# Patient Record
Sex: Male | Born: 1935 | Race: Black or African American | Hispanic: No | Marital: Single
Health system: Southern US, Community
[De-identification: ages and names within clinical notes are randomized; demographics above are authoritative.]

---

## 2007-05-06 ENCOUNTER — Ambulatory Visit: Payer: Self-pay | Admitting: Internal Medicine

## 2007-05-07 ENCOUNTER — Ambulatory Visit: Payer: Self-pay | Admitting: Internal Medicine

## 2007-05-08 ENCOUNTER — Ambulatory Visit: Payer: Self-pay | Admitting: Internal Medicine

## 2007-05-12 ENCOUNTER — Other Ambulatory Visit: Payer: Self-pay

## 2007-05-12 ENCOUNTER — Inpatient Hospital Stay: Payer: Self-pay | Admitting: Internal Medicine

## 2007-05-29 ENCOUNTER — Ambulatory Visit: Payer: Self-pay | Admitting: Internal Medicine

## 2007-06-06 ENCOUNTER — Ambulatory Visit: Payer: Self-pay | Admitting: Internal Medicine

## 2007-06-10 ENCOUNTER — Ambulatory Visit: Payer: Self-pay | Admitting: Family Medicine

## 2007-06-14 ENCOUNTER — Ambulatory Visit: Payer: Self-pay | Admitting: Internal Medicine

## 2007-06-15 ENCOUNTER — Ambulatory Visit: Payer: Self-pay

## 2007-07-07 ENCOUNTER — Ambulatory Visit: Payer: Self-pay | Admitting: Internal Medicine

## 2007-07-08 ENCOUNTER — Ambulatory Visit: Payer: Self-pay | Admitting: Internal Medicine

## 2007-07-13 ENCOUNTER — Emergency Department: Payer: Self-pay | Admitting: Emergency Medicine

## 2007-07-13 ENCOUNTER — Ambulatory Visit: Payer: Self-pay

## 2007-07-14 ENCOUNTER — Ambulatory Visit: Payer: Self-pay

## 2007-07-14 ENCOUNTER — Other Ambulatory Visit: Payer: Self-pay

## 2007-07-27 ENCOUNTER — Ambulatory Visit: Payer: Self-pay

## 2007-08-04 ENCOUNTER — Ambulatory Visit: Payer: Self-pay | Admitting: Internal Medicine

## 2007-09-04 ENCOUNTER — Ambulatory Visit: Payer: Self-pay | Admitting: Internal Medicine

## 2007-10-04 ENCOUNTER — Ambulatory Visit: Payer: Self-pay | Admitting: Internal Medicine

## 2007-11-04 ENCOUNTER — Ambulatory Visit: Payer: Self-pay | Admitting: Internal Medicine

## 2007-12-04 ENCOUNTER — Ambulatory Visit: Payer: Self-pay | Admitting: Internal Medicine

## 2008-02-12 ENCOUNTER — Ambulatory Visit: Payer: Self-pay | Admitting: Internal Medicine

## 2008-02-21 ENCOUNTER — Ambulatory Visit: Payer: Self-pay | Admitting: Family Medicine

## 2008-03-05 ENCOUNTER — Ambulatory Visit: Payer: Self-pay | Admitting: Internal Medicine

## 2008-05-05 ENCOUNTER — Ambulatory Visit: Payer: Self-pay | Admitting: Internal Medicine

## 2008-05-06 ENCOUNTER — Ambulatory Visit: Payer: Self-pay | Admitting: Internal Medicine

## 2008-06-05 ENCOUNTER — Ambulatory Visit: Payer: Self-pay | Admitting: Internal Medicine

## 2008-07-06 ENCOUNTER — Ambulatory Visit: Payer: Self-pay | Admitting: Internal Medicine

## 2008-08-03 ENCOUNTER — Ambulatory Visit: Payer: Self-pay | Admitting: Internal Medicine

## 2008-09-03 ENCOUNTER — Ambulatory Visit: Payer: Self-pay | Admitting: Internal Medicine

## 2008-09-08 ENCOUNTER — Ambulatory Visit: Payer: Self-pay | Admitting: Internal Medicine

## 2008-10-03 ENCOUNTER — Ambulatory Visit: Payer: Self-pay | Admitting: Internal Medicine

## 2008-11-03 ENCOUNTER — Ambulatory Visit: Payer: Self-pay | Admitting: Internal Medicine

## 2008-12-03 ENCOUNTER — Ambulatory Visit: Payer: Self-pay | Admitting: Internal Medicine

## 2009-01-03 ENCOUNTER — Ambulatory Visit: Payer: Self-pay | Admitting: Internal Medicine

## 2009-02-03 ENCOUNTER — Ambulatory Visit: Payer: Self-pay | Admitting: Internal Medicine

## 2009-03-05 ENCOUNTER — Ambulatory Visit: Payer: Self-pay | Admitting: Internal Medicine

## 2009-04-05 ENCOUNTER — Ambulatory Visit: Payer: Self-pay | Admitting: Internal Medicine

## 2009-05-05 ENCOUNTER — Ambulatory Visit: Payer: Self-pay | Admitting: Internal Medicine

## 2009-06-05 ENCOUNTER — Ambulatory Visit: Payer: Self-pay | Admitting: Internal Medicine

## 2009-06-07 ENCOUNTER — Ambulatory Visit: Payer: Self-pay | Admitting: Internal Medicine

## 2009-07-06 ENCOUNTER — Ambulatory Visit: Payer: Self-pay | Admitting: Internal Medicine

## 2009-08-03 ENCOUNTER — Ambulatory Visit: Payer: Self-pay | Admitting: Internal Medicine

## 2009-09-03 ENCOUNTER — Ambulatory Visit: Payer: Self-pay | Admitting: Internal Medicine

## 2009-10-03 ENCOUNTER — Ambulatory Visit: Payer: Self-pay | Admitting: Internal Medicine

## 2009-11-22 ENCOUNTER — Ambulatory Visit: Payer: Self-pay | Admitting: Internal Medicine

## 2009-11-28 ENCOUNTER — Emergency Department: Payer: Self-pay | Admitting: Emergency Medicine

## 2009-12-03 ENCOUNTER — Ambulatory Visit: Payer: Self-pay | Admitting: Internal Medicine

## 2009-12-13 ENCOUNTER — Ambulatory Visit: Payer: Self-pay | Admitting: Internal Medicine

## 2010-01-03 ENCOUNTER — Ambulatory Visit: Payer: Self-pay | Admitting: Internal Medicine

## 2010-01-03 ENCOUNTER — Inpatient Hospital Stay: Payer: Self-pay | Admitting: Internal Medicine

## 2010-01-12 ENCOUNTER — Ambulatory Visit: Payer: Self-pay | Admitting: Vascular Surgery

## 2010-01-15 ENCOUNTER — Emergency Department: Payer: Self-pay | Admitting: Emergency Medicine

## 2010-01-17 ENCOUNTER — Ambulatory Visit: Payer: Self-pay | Admitting: Internal Medicine

## 2010-01-19 ENCOUNTER — Other Ambulatory Visit: Payer: Self-pay | Admitting: Geriatric Medicine

## 2010-02-03 ENCOUNTER — Ambulatory Visit: Payer: Self-pay | Admitting: Internal Medicine

## 2010-03-05 ENCOUNTER — Ambulatory Visit: Payer: Self-pay | Admitting: Internal Medicine

## 2010-03-09 ENCOUNTER — Ambulatory Visit: Payer: Self-pay | Admitting: Vascular Surgery

## 2010-03-16 ENCOUNTER — Ambulatory Visit: Payer: Self-pay | Admitting: Vascular Surgery

## 2010-03-23 ENCOUNTER — Emergency Department: Payer: Self-pay | Admitting: Emergency Medicine

## 2010-04-27 ENCOUNTER — Ambulatory Visit: Payer: Self-pay | Admitting: Internal Medicine

## 2010-07-16 ENCOUNTER — Emergency Department: Payer: Self-pay | Admitting: Emergency Medicine

## 2010-07-18 ENCOUNTER — Ambulatory Visit: Payer: Self-pay | Admitting: Vascular Surgery

## 2010-10-04 ENCOUNTER — Ambulatory Visit: Payer: Self-pay | Admitting: Internal Medicine

## 2010-10-15 ENCOUNTER — Ambulatory Visit: Payer: Self-pay | Admitting: Vascular Surgery

## 2010-10-22 ENCOUNTER — Other Ambulatory Visit: Payer: Self-pay | Admitting: Geriatric Medicine

## 2010-10-31 ENCOUNTER — Inpatient Hospital Stay: Payer: Self-pay | Admitting: Internal Medicine

## 2010-11-04 ENCOUNTER — Ambulatory Visit: Payer: Self-pay | Admitting: Internal Medicine

## 2011-01-06 ENCOUNTER — Ambulatory Visit: Payer: Self-pay | Admitting: Internal Medicine

## 2011-02-04 ENCOUNTER — Ambulatory Visit: Payer: Self-pay | Admitting: Internal Medicine

## 2011-03-06 ENCOUNTER — Ambulatory Visit: Payer: Self-pay | Admitting: Internal Medicine

## 2011-07-03 ENCOUNTER — Ambulatory Visit: Payer: Self-pay | Admitting: Internal Medicine

## 2011-07-03 LAB — CBC CANCER CENTER
Eosinophil #: 0.2 x10 3/mm (ref 0.0–0.7)
Eosinophil %: 4.2 %
HCT: 29 % — ABNORMAL LOW (ref 40.0–52.0)
Lymphocyte #: 1.1 x10 3/mm (ref 1.0–3.6)
MCH: 33.2 pg (ref 26.0–34.0)
MCHC: 33 g/dL (ref 32.0–36.0)
MCV: 101 fL — ABNORMAL HIGH (ref 80–100)
Monocyte #: 0.7 x10 3/mm (ref 0.0–0.7)
Platelet: 148 x10 3/mm — ABNORMAL LOW (ref 150–440)
RBC: 2.88 10*6/uL — ABNORMAL LOW (ref 4.40–5.90)

## 2011-07-07 ENCOUNTER — Ambulatory Visit: Payer: Self-pay | Admitting: Internal Medicine

## 2012-02-12 ENCOUNTER — Ambulatory Visit: Payer: Self-pay | Admitting: Vascular Surgery

## 2012-03-04 ENCOUNTER — Ambulatory Visit: Payer: Self-pay | Admitting: Internal Medicine

## 2012-03-04 LAB — CBC CANCER CENTER
Eosinophil %: 2.1 %
Lymphocyte #: 0.8 x10 3/mm — ABNORMAL LOW (ref 1.0–3.6)
Lymphocyte %: 18.3 %
MCH: 30.9 pg (ref 26.0–34.0)
MCV: 98 fL (ref 80–100)
Monocyte #: 0.5 x10 3/mm (ref 0.2–1.0)
Monocyte %: 11.2 %
Neutrophil %: 68 %
Platelet: 128 x10 3/mm — ABNORMAL LOW (ref 150–440)
RDW: 16.2 % — ABNORMAL HIGH (ref 11.5–14.5)
WBC: 4.4 x10 3/mm (ref 3.8–10.6)

## 2012-03-04 LAB — RETICULOCYTES
Absolute Retic Count: 0.0711 10*6/uL (ref 0.031–0.129)
Reticulocyte: 2.07 % (ref 0.7–2.5)

## 2012-03-05 ENCOUNTER — Ambulatory Visit: Payer: Self-pay | Admitting: Internal Medicine

## 2012-03-22 ENCOUNTER — Ambulatory Visit: Payer: Self-pay | Admitting: Vascular Surgery

## 2012-04-15 ENCOUNTER — Ambulatory Visit: Payer: Self-pay | Admitting: Vascular Surgery

## 2012-04-17 ENCOUNTER — Ambulatory Visit: Payer: Self-pay | Admitting: Vascular Surgery

## 2012-04-17 LAB — BASIC METABOLIC PANEL
BUN: 36 mg/dL — ABNORMAL HIGH (ref 7–18)
EGFR (African American): 8 — ABNORMAL LOW
EGFR (Non-African Amer.): 7 — ABNORMAL LOW
Glucose: 101 mg/dL — ABNORMAL HIGH (ref 65–99)
Osmolality: 282 (ref 275–301)
Potassium: 3.9 mmol/L (ref 3.5–5.1)
Sodium: 137 mmol/L (ref 136–145)

## 2012-04-17 LAB — CBC
HCT: 30.9 % — ABNORMAL LOW (ref 40.0–52.0)
MCH: 32.7 pg (ref 26.0–34.0)
MCHC: 34 g/dL (ref 32.0–36.0)
MCV: 96 fL (ref 80–100)
Platelet: 111 10*3/uL — ABNORMAL LOW (ref 150–440)
RDW: 16.4 % — ABNORMAL HIGH (ref 11.5–14.5)
WBC: 4.1 10*3/uL (ref 3.8–10.6)

## 2012-04-24 ENCOUNTER — Ambulatory Visit: Payer: Self-pay | Admitting: Vascular Surgery

## 2012-04-25 ENCOUNTER — Emergency Department: Payer: Self-pay | Admitting: Emergency Medicine

## 2012-04-25 LAB — CBC
HCT: 31.4 % — ABNORMAL LOW (ref 40.0–52.0)
HGB: 10.3 g/dL — ABNORMAL LOW (ref 13.0–18.0)
RBC: 3.25 10*6/uL — ABNORMAL LOW (ref 4.40–5.90)
RDW: 17.2 % — ABNORMAL HIGH (ref 11.5–14.5)
WBC: 6.3 10*3/uL (ref 3.8–10.6)

## 2012-04-25 LAB — COMPREHENSIVE METABOLIC PANEL
Albumin: 3 g/dL — ABNORMAL LOW (ref 3.4–5.0)
Alkaline Phosphatase: 111 U/L (ref 50–136)
BUN: 10 mg/dL (ref 7–18)
Bilirubin,Total: 0.3 mg/dL (ref 0.2–1.0)
Calcium, Total: 9 mg/dL (ref 8.5–10.1)
Chloride: 102 mmol/L (ref 98–107)
Creatinine: 3.68 mg/dL — ABNORMAL HIGH (ref 0.60–1.30)
EGFR (African American): 17 — ABNORMAL LOW
Glucose: 80 mg/dL (ref 65–99)
SGOT(AST): 22 U/L (ref 15–37)
SGPT (ALT): 10 U/L — ABNORMAL LOW (ref 12–78)
Total Protein: 7.7 g/dL (ref 6.4–8.2)

## 2012-04-25 LAB — TROPONIN I: Troponin-I: <0.02 ng/mL

## 2012-04-25 LAB — VALPROIC ACID LEVEL: Valproic Acid: 54 ug/mL

## 2012-05-01 LAB — CULTURE, BLOOD (SINGLE)

## 2012-06-10 ENCOUNTER — Ambulatory Visit: Payer: Self-pay | Admitting: Vascular Surgery

## 2012-07-15 ENCOUNTER — Ambulatory Visit: Payer: Self-pay | Admitting: Vascular Surgery

## 2012-08-03 ENCOUNTER — Ambulatory Visit: Payer: Self-pay | Admitting: Internal Medicine

## 2012-08-19 LAB — CBC CANCER CENTER
Basophil #: 0 x10 3/mm (ref 0.0–0.1)
Eosinophil #: 0.1 x10 3/mm (ref 0.0–0.7)
Eosinophil %: 2.5 %
MCH: 31.4 pg (ref 26.0–34.0)
MCHC: 32.4 g/dL (ref 32.0–36.0)
MCV: 97 fL (ref 80–100)
Monocyte #: 0.6 x10 3/mm (ref 0.2–1.0)
Neutrophil #: 2.3 x10 3/mm (ref 1.4–6.5)
Neutrophil %: 56.6 %
Platelet: 91 x10 3/mm — ABNORMAL LOW (ref 150–440)
RBC: 4.04 10*6/uL — ABNORMAL LOW (ref 4.40–5.90)

## 2012-08-19 LAB — FOLATE: Folic Acid: 19.6 ng/mL (ref 3.1–100.0)

## 2012-08-19 LAB — IRON AND TIBC: Iron: 55 ug/dL — ABNORMAL LOW (ref 65–175)

## 2012-09-06 ENCOUNTER — Ambulatory Visit: Payer: Self-pay | Admitting: Vascular Surgery

## 2012-09-07 ENCOUNTER — Ambulatory Visit: Payer: Self-pay | Admitting: Internal Medicine

## 2012-09-23 ENCOUNTER — Ambulatory Visit: Payer: Self-pay | Admitting: Vascular Surgery

## 2012-09-23 LAB — CBC
HCT: 30.9 % — ABNORMAL LOW (ref 40.0–52.0)
MCH: 31.1 pg (ref 26.0–34.0)
MCHC: 32.4 g/dL (ref 32.0–36.0)
MCV: 96 fL (ref 80–100)
Platelet: 79 10*3/uL — ABNORMAL LOW (ref 150–440)
RBC: 3.22 10*6/uL — ABNORMAL LOW (ref 4.40–5.90)
RDW: 17.2 % — ABNORMAL HIGH (ref 11.5–14.5)

## 2012-09-23 LAB — BASIC METABOLIC PANEL
Anion Gap: 7 (ref 7–16)
BUN: 38 mg/dL — ABNORMAL HIGH (ref 7–18)
Calcium, Total: 9 mg/dL (ref 8.5–10.1)
Chloride: 100 mmol/L (ref 98–107)
Co2: 31 mmol/L (ref 21–32)
Creatinine: 7.44 mg/dL — ABNORMAL HIGH (ref 0.60–1.30)
Osmolality: 283 (ref 275–301)
Potassium: 3.7 mmol/L (ref 3.5–5.1)
Sodium: 138 mmol/L (ref 136–145)

## 2012-09-27 ENCOUNTER — Ambulatory Visit: Payer: Self-pay | Admitting: Vascular Surgery

## 2012-09-27 LAB — POTASSIUM: Potassium: 3.4 mmol/L — ABNORMAL LOW (ref 3.5–5.1)

## 2012-12-04 ENCOUNTER — Ambulatory Visit: Payer: Self-pay | Admitting: Vascular Surgery

## 2013-02-19 ENCOUNTER — Ambulatory Visit: Payer: Self-pay | Admitting: Physician Assistant

## 2013-03-24 ENCOUNTER — Ambulatory Visit: Payer: Self-pay | Admitting: Vascular Surgery

## 2013-03-24 LAB — BASIC METABOLIC PANEL
Calcium, Total: 9.3 mg/dL (ref 8.5–10.1)
Co2: 27 mmol/L (ref 21–32)
Creatinine: 11.09 mg/dL — ABNORMAL HIGH (ref 0.60–1.30)
EGFR (African American): 4 — ABNORMAL LOW
Glucose: 73 mg/dL (ref 65–99)
Osmolality: 286 (ref 275–301)
Potassium: 5.1 mmol/L (ref 3.5–5.1)
Sodium: 134 mmol/L — ABNORMAL LOW (ref 136–145)

## 2013-04-04 ENCOUNTER — Ambulatory Visit: Payer: Self-pay | Admitting: Vascular Surgery

## 2013-04-15 LAB — CBC
HCT: 28.9 % — ABNORMAL LOW (ref 40.0–52.0)
HGB: 9.5 g/dL — ABNORMAL LOW (ref 13.0–18.0)
MCH: 33.7 pg (ref 26.0–34.0)
MCV: 102 fL — ABNORMAL HIGH (ref 80–100)
Platelet: 146 10*3/uL — ABNORMAL LOW (ref 150–440)
RDW: 17.4 % — ABNORMAL HIGH (ref 11.5–14.5)
WBC: 3.9 10*3/uL (ref 3.8–10.6)

## 2013-04-15 LAB — COMPREHENSIVE METABOLIC PANEL
Albumin: 3.3 g/dL — ABNORMAL LOW (ref 3.4–5.0)
Anion Gap: 6 — ABNORMAL LOW (ref 7–16)
BUN: 44 mg/dL — ABNORMAL HIGH (ref 7–18)
Calcium, Total: 9.4 mg/dL (ref 8.5–10.1)
Co2: 29 mmol/L (ref 21–32)
Creatinine: 7.78 mg/dL — ABNORMAL HIGH (ref 0.60–1.30)
EGFR (African American): 7 — ABNORMAL LOW
EGFR (Non-African Amer.): 6 — ABNORMAL LOW
SGOT(AST): 24 U/L (ref 15–37)
SGPT (ALT): 13 U/L (ref 12–78)
Sodium: 138 mmol/L (ref 136–145)
Total Protein: 7.7 g/dL (ref 6.4–8.2)

## 2013-04-15 LAB — TSH: Thyroid Stimulating Horm: 2.06 u[IU]/mL

## 2013-04-15 LAB — ETHANOL
Ethanol %: <0.003 % (ref 0.000–0.080)
Ethanol: <3 mg/dL

## 2013-04-16 LAB — URINALYSIS, COMPLETE
Bacteria: NONE SEEN
Glucose,UR: NEGATIVE mg/dL (ref 0–75)
Nitrite: NEGATIVE
Ph: 8 (ref 4.5–8.0)
RBC,UR: 4 /HPF (ref 0–5)
Specific Gravity: 1.006 (ref 1.003–1.030)
Squamous Epithelial: <1
WBC UR: 9 /HPF (ref 0–5)

## 2013-04-16 LAB — BASIC METABOLIC PANEL
Anion Gap: 8 (ref 7–16)
BUN: 52 mg/dL — ABNORMAL HIGH (ref 7–18)
Co2: 29 mmol/L (ref 21–32)
Creatinine: 8.97 mg/dL — ABNORMAL HIGH (ref 0.60–1.30)
EGFR (African American): 6 — ABNORMAL LOW
EGFR (Non-African Amer.): 5 — ABNORMAL LOW
Glucose: 81 mg/dL (ref 65–99)
Osmolality: 292 (ref 275–301)
Potassium: 4.2 mmol/L (ref 3.5–5.1)
Sodium: 140 mmol/L (ref 136–145)

## 2013-04-16 LAB — DRUG SCREEN, URINE
Amphetamines, Ur Screen: NEGATIVE (ref ?–1000)
Barbiturates, Ur Screen: NEGATIVE (ref ?–200)
MDMA (Ecstasy)Ur Screen: NEGATIVE (ref ?–500)
Methadone, Ur Screen: NEGATIVE (ref ?–300)
Opiate, Ur Screen: NEGATIVE (ref ?–300)
Tricyclic, Ur Screen: NEGATIVE (ref ?–1000)

## 2013-04-17 LAB — BASIC METABOLIC PANEL
Anion Gap: 4 — ABNORMAL LOW (ref 7–16)
BUN: 72 mg/dL — ABNORMAL HIGH (ref 7–18)
Creatinine: 10.13 mg/dL — ABNORMAL HIGH (ref 0.60–1.30)
EGFR (African American): 5 — ABNORMAL LOW
EGFR (Non-African Amer.): 4 — ABNORMAL LOW
Glucose: 72 mg/dL (ref 65–99)
Potassium: 5 mmol/L (ref 3.5–5.1)
Sodium: 141 mmol/L (ref 136–145)

## 2013-04-18 LAB — BASIC METABOLIC PANEL
BUN: 76 mg/dL — ABNORMAL HIGH (ref 7–18)
EGFR (African American): 4 — ABNORMAL LOW
EGFR (Non-African Amer.): 4 — ABNORMAL LOW
Osmolality: 302 (ref 275–301)
Potassium: 4.9 mmol/L (ref 3.5–5.1)

## 2013-04-18 LAB — CBC WITH DIFFERENTIAL/PLATELET
Basophil #: 0 10*3/uL (ref 0.0–0.1)
Basophil %: 0.6 %
Eosinophil #: 0.1 10*3/uL (ref 0.0–0.7)
HCT: 28.7 % — ABNORMAL LOW (ref 40.0–52.0)
HGB: 9.6 g/dL — ABNORMAL LOW (ref 13.0–18.0)
Lymphocyte #: 0.8 10*3/uL — ABNORMAL LOW (ref 1.0–3.6)
Lymphocyte %: 17.6 %
Monocyte #: 0.5 x10 3/mm (ref 0.2–1.0)
Neutrophil #: 3 10*3/uL (ref 1.4–6.5)
RDW: 16.8 % — ABNORMAL HIGH (ref 11.5–14.5)

## 2013-04-18 LAB — PHOSPHORUS: Phosphorus: 5.2 mg/dL — ABNORMAL HIGH (ref 2.5–4.9)

## 2013-04-21 ENCOUNTER — Inpatient Hospital Stay: Payer: Self-pay | Admitting: Internal Medicine

## 2013-04-21 ENCOUNTER — Ambulatory Visit: Payer: Self-pay | Admitting: Internal Medicine

## 2013-05-05 ENCOUNTER — Ambulatory Visit: Payer: Self-pay | Admitting: Internal Medicine

## 2013-06-18 ENCOUNTER — Ambulatory Visit: Payer: Self-pay | Admitting: Vascular Surgery

## 2013-06-30 ENCOUNTER — Ambulatory Visit: Payer: Self-pay | Admitting: Vascular Surgery

## 2013-07-21 ENCOUNTER — Ambulatory Visit: Payer: Self-pay | Admitting: Vascular Surgery

## 2013-09-03 ENCOUNTER — Ambulatory Visit: Payer: Self-pay | Admitting: Physician Assistant

## 2013-09-29 LAB — CBC
HCT: 29.5 % — ABNORMAL LOW (ref 40.0–52.0)
HCT: 29.4 % — ABNORMAL LOW (ref 40.0–52.0)
HGB: 9.8 g/dL — ABNORMAL LOW (ref 13.0–18.0)
HGB: 9.8 g/dL — ABNORMAL LOW (ref 13.0–18.0)
MCH: 33.4 pg (ref 26.0–34.0)
MCH: 33.5 pg (ref 26.0–34.0)
MCHC: 33.1 g/dL (ref 32.0–36.0)
MCHC: 33.4 g/dL (ref 32.0–36.0)
MCV: 101 fL — AB (ref 80–100)
MCV: 100 fL (ref 80–100)
Platelet: 102 10*3/uL — ABNORMAL LOW (ref 150–440)
Platelet: 111 10*3/uL — ABNORMAL LOW (ref 150–440)
RBC: 2.92 10*6/uL — AB (ref 4.40–5.90)
RBC: 2.93 10*6/uL — AB (ref 4.40–5.90)
RDW: 14.1 % (ref 11.5–14.5)
RDW: 14 % (ref 11.5–14.5)
WBC: 8.8 10*3/uL (ref 3.8–10.6)
WBC: 10.2 10*3/uL (ref 3.8–10.6)

## 2013-09-29 LAB — APTT: Activated PTT: 26.4 secs (ref 23.6–35.9)

## 2013-09-29 LAB — COMPREHENSIVE METABOLIC PANEL
ALBUMIN: 3.5 g/dL (ref 3.4–5.0)
AST: 22 U/L (ref 15–37)
Alkaline Phosphatase: 171 U/L — ABNORMAL HIGH
Anion Gap: 8 (ref 7–16)
BUN: 65 mg/dL — ABNORMAL HIGH (ref 7–18)
Bilirubin,Total: 0.4 mg/dL (ref 0.2–1.0)
Calcium, Total: 9.3 mg/dL (ref 8.5–10.1)
Chloride: 100 mmol/L (ref 98–107)
Co2: 28 mmol/L (ref 21–32)
Creatinine: 8.05 mg/dL — ABNORMAL HIGH (ref 0.60–1.30)
EGFR (Non-African Amer.): 6 — ABNORMAL LOW
GFR CALC AF AMER: 7 — AB
Glucose: 99 mg/dL (ref 65–99)
OSMOLALITY: 291 (ref 275–301)
Potassium: 4.7 mmol/L (ref 3.5–5.1)
SGPT (ALT): 14 U/L (ref 12–78)
SODIUM: 136 mmol/L (ref 136–145)
TOTAL PROTEIN: 7.9 g/dL (ref 6.4–8.2)

## 2013-09-29 LAB — PROTIME-INR
INR: 1.1
PROTHROMBIN TIME: 13.9 s (ref 11.5–14.7)

## 2013-09-30 ENCOUNTER — Inpatient Hospital Stay: Payer: Self-pay | Admitting: Internal Medicine

## 2013-09-30 LAB — CBC WITH DIFFERENTIAL/PLATELET
BASOS ABS: 0.1 10*3/uL (ref 0.0–0.1)
BASOS ABS: 0 10*3/uL (ref 0.0–0.1)
BASOS PCT: 0.7 %
BASOS PCT: 0.2 %
Eosinophil #: 0 10*3/uL (ref 0.0–0.7)
Eosinophil #: 0 10*3/uL (ref 0.0–0.7)
Eosinophil %: 0 %
Eosinophil %: 0.1 %
HCT: 24.4 % — ABNORMAL LOW (ref 40.0–52.0)
HCT: 23 % — AB (ref 40.0–52.0)
HGB: 8.1 g/dL — ABNORMAL LOW (ref 13.0–18.0)
HGB: 7.7 g/dL — AB (ref 13.0–18.0)
LYMPHS ABS: 0.4 10*3/uL — AB (ref 1.0–3.6)
LYMPHS PCT: 7.9 %
Lymphocyte #: 0.8 10*3/uL — ABNORMAL LOW (ref 1.0–3.6)
Lymphocyte %: 7.9 %
MCH: 33.5 pg (ref 26.0–34.0)
MCH: 33.4 pg (ref 26.0–34.0)
MCHC: 33.3 g/dL (ref 32.0–36.0)
MCHC: 33.3 g/dL (ref 32.0–36.0)
MCV: 101 fL — AB (ref 80–100)
MCV: 101 fL — ABNORMAL HIGH (ref 80–100)
MONO ABS: 1 x10 3/mm (ref 0.2–1.0)
MONO ABS: 0.5 x10 3/mm (ref 0.2–1.0)
MONOS PCT: 9.5 %
Monocyte %: 10.6 %
NEUTROS ABS: 4 10*3/uL (ref 1.4–6.5)
NEUTROS PCT: 80.8 %
NEUTROS PCT: 82.3 %
Neutrophil #: 8 10*3/uL — ABNORMAL HIGH (ref 1.4–6.5)
PLATELETS: 105 10*3/uL — AB (ref 150–440)
Platelet: 118 10*3/uL — ABNORMAL LOW (ref 150–440)
RBC: 2.42 10*6/uL — ABNORMAL LOW (ref 4.40–5.90)
RBC: 2.29 10*6/uL — AB (ref 4.40–5.90)
RDW: 13.8 % (ref 11.5–14.5)
RDW: 14.2 % (ref 11.5–14.5)
WBC: 9.8 10*3/uL (ref 3.8–10.6)
WBC: 4.8 10*3/uL (ref 3.8–10.6)

## 2013-09-30 LAB — HEMOGLOBIN
HGB: 9.2 g/dL — AB (ref 13.0–18.0)
HGB: 9.1 g/dL — ABNORMAL LOW (ref 13.0–18.0)

## 2013-09-30 LAB — PHOSPHORUS: PHOSPHORUS: 5.6 mg/dL — AB (ref 2.5–4.9)

## 2013-10-01 LAB — HEMOGLOBIN
HGB: 10.4 g/dL — ABNORMAL LOW (ref 13.0–18.0)
HGB: 9.2 g/dL — ABNORMAL LOW (ref 13.0–18.0)
HGB: 8.9 g/dL — ABNORMAL LOW (ref 13.0–18.0)
HGB: 9.2 g/dL — ABNORMAL LOW (ref 13.0–18.0)
HGB: 9 g/dL — ABNORMAL LOW (ref 13.0–18.0)

## 2013-10-02 LAB — HEMOGLOBIN
HGB: 8.7 g/dL — ABNORMAL LOW (ref 13.0–18.0)
HGB: 8.4 g/dL — ABNORMAL LOW (ref 13.0–18.0)
HGB: 9.6 g/dL — ABNORMAL LOW (ref 13.0–18.0)

## 2013-10-02 LAB — PHOSPHORUS: Phosphorus: 6.5 mg/dL — ABNORMAL HIGH (ref 2.5–4.9)

## 2013-10-03 LAB — HEMOGLOBIN
HGB: 9.5 g/dL — AB (ref 13.0–18.0)
HGB: 9.7 g/dL — ABNORMAL LOW (ref 13.0–18.0)
HGB: 10.2 g/dL — AB (ref 13.0–18.0)

## 2013-10-03 LAB — CLOSTRIDIUM DIFFICILE(ARMC)

## 2013-10-04 LAB — CBC WITH DIFFERENTIAL/PLATELET
BASOS ABS: 0 10*3/uL (ref 0.0–0.1)
Basophil %: 0.3 %
Eosinophil #: 0.1 10*3/uL (ref 0.0–0.7)
Eosinophil %: 1.6 %
HCT: 27.1 % — ABNORMAL LOW (ref 40.0–52.0)
HGB: 9.1 g/dL — AB (ref 13.0–18.0)
LYMPHS ABS: 1.2 10*3/uL (ref 1.0–3.6)
Lymphocyte %: 18.9 %
MCH: 32.4 pg (ref 26.0–34.0)
MCHC: 33.6 g/dL (ref 32.0–36.0)
MCV: 96 fL (ref 80–100)
Monocyte #: 0.8 x10 3/mm (ref 0.2–1.0)
Monocyte %: 11.6 %
Neutrophil #: 4.4 10*3/uL (ref 1.4–6.5)
Neutrophil %: 67.6 %
Platelet: 168 10*3/uL (ref 150–440)
RBC: 2.82 10*6/uL — AB (ref 4.40–5.90)
RDW: 15.9 % — AB (ref 11.5–14.5)
WBC: 6.6 10*3/uL (ref 3.8–10.6)

## 2013-10-04 LAB — CULTURE, BLOOD (SINGLE)

## 2013-10-15 ENCOUNTER — Emergency Department: Payer: Self-pay | Admitting: Internal Medicine

## 2013-10-15 LAB — SALICYLATE LEVEL: SALICYLATES, SERUM: 2.6 mg/dL

## 2013-10-15 LAB — COMPREHENSIVE METABOLIC PANEL
ALBUMIN: 3.1 g/dL — AB (ref 3.4–5.0)
ANION GAP: 13 (ref 7–16)
Alkaline Phosphatase: 130 U/L — ABNORMAL HIGH
BUN: 24 mg/dL — AB (ref 7–18)
Bilirubin,Total: 0.5 mg/dL (ref 0.2–1.0)
CHLORIDE: 93 mmol/L — AB (ref 98–107)
CREATININE: 5.89 mg/dL — AB (ref 0.60–1.30)
Calcium, Total: 10.3 mg/dL — ABNORMAL HIGH (ref 8.5–10.1)
Co2: 27 mmol/L (ref 21–32)
EGFR (African American): 10 — ABNORMAL LOW
GFR CALC NON AF AMER: 8 — AB
Glucose: 66 mg/dL (ref 65–99)
Osmolality: 269 (ref 275–301)
Potassium: 3.9 mmol/L (ref 3.5–5.1)
SGOT(AST): 15 U/L (ref 15–37)
SGPT (ALT): 10 U/L — ABNORMAL LOW (ref 12–78)
SODIUM: 133 mmol/L — AB (ref 136–145)
Total Protein: 8.8 g/dL — ABNORMAL HIGH (ref 6.4–8.2)

## 2013-10-15 LAB — CBC
HCT: 33.1 % — AB (ref 40.0–52.0)
HGB: 10.7 g/dL — ABNORMAL LOW (ref 13.0–18.0)
MCH: 31.2 pg (ref 26.0–34.0)
MCHC: 32.2 g/dL (ref 32.0–36.0)
MCV: 97 fL (ref 80–100)
Platelet: 246 10*3/uL (ref 150–440)
RBC: 3.42 10*6/uL — AB (ref 4.40–5.90)
RDW: 15.5 % — AB (ref 11.5–14.5)
WBC: 6.3 10*3/uL (ref 3.8–10.6)

## 2013-10-15 LAB — ETHANOL: Ethanol %: <0.003 % (ref 0.000–0.080)

## 2013-10-15 LAB — TSH: THYROID STIMULATING HORM: 1.38 u[IU]/mL

## 2013-10-15 LAB — TROPONIN I: Troponin-I: <0.02 ng/mL

## 2013-10-15 LAB — ACETAMINOPHEN LEVEL: Acetaminophen: <2 ug/mL

## 2013-10-15 LAB — AMMONIA: Ammonia, Plasma: <10 mcmol/L (ref 11–32)

## 2013-10-15 LAB — VALPROIC ACID LEVEL: VALPROIC ACID: 8 ug/mL — AB

## 2013-10-16 LAB — BASIC METABOLIC PANEL
ANION GAP: 8 (ref 7–16)
BUN: 33 mg/dL — ABNORMAL HIGH (ref 7–18)
CHLORIDE: 97 mmol/L — AB (ref 98–107)
Calcium, Total: 9.5 mg/dL (ref 8.5–10.1)
Co2: 30 mmol/L (ref 21–32)
Creatinine: 7.84 mg/dL — ABNORMAL HIGH (ref 0.60–1.30)
EGFR (African American): 7 — ABNORMAL LOW
GFR CALC NON AF AMER: 6 — AB
Glucose: 151 mg/dL — ABNORMAL HIGH (ref 65–99)
Osmolality: 280 (ref 275–301)
Potassium: 3.9 mmol/L (ref 3.5–5.1)
SODIUM: 135 mmol/L — AB (ref 136–145)

## 2013-10-31 ENCOUNTER — Emergency Department: Payer: Self-pay | Admitting: Emergency Medicine

## 2013-10-31 LAB — CBC WITH DIFFERENTIAL/PLATELET
Basophil #: 0 10*3/uL (ref 0.0–0.1)
Basophil %: 0.7 %
EOS ABS: 0.1 10*3/uL (ref 0.0–0.7)
Eosinophil %: 1.2 %
HCT: 28.2 % — ABNORMAL LOW (ref 40.0–52.0)
HGB: 9.2 g/dL — ABNORMAL LOW (ref 13.0–18.0)
LYMPHS ABS: 0.8 10*3/uL — AB (ref 1.0–3.6)
LYMPHS PCT: 13.7 %
MCH: 30.6 pg (ref 26.0–34.0)
MCHC: 32.5 g/dL (ref 32.0–36.0)
MCV: 94 fL (ref 80–100)
Monocyte #: 0.5 x10 3/mm (ref 0.2–1.0)
Monocyte %: 7.9 %
NEUTROS ABS: 4.7 10*3/uL (ref 1.4–6.5)
Neutrophil %: 76.5 %
PLATELETS: 190 10*3/uL (ref 150–440)
RBC: 3 10*6/uL — ABNORMAL LOW (ref 4.40–5.90)
RDW: 16.5 % — ABNORMAL HIGH (ref 11.5–14.5)
WBC: 6.2 10*3/uL (ref 3.8–10.6)

## 2013-10-31 LAB — BASIC METABOLIC PANEL
Anion Gap: 7 (ref 7–16)
BUN: 49 mg/dL — AB (ref 7–18)
CALCIUM: 9.7 mg/dL (ref 8.5–10.1)
CHLORIDE: 100 mmol/L (ref 98–107)
CREATININE: 9.81 mg/dL — AB (ref 0.60–1.30)
Co2: 29 mmol/L (ref 21–32)
EGFR (Non-African Amer.): 4 — ABNORMAL LOW
GFR CALC AF AMER: 5 — AB
Glucose: 95 mg/dL (ref 65–99)
Osmolality: 285 (ref 275–301)
Potassium: 4.4 mmol/L (ref 3.5–5.1)
Sodium: 136 mmol/L (ref 136–145)

## 2013-11-17 ENCOUNTER — Ambulatory Visit: Payer: Self-pay | Admitting: Vascular Surgery

## 2013-11-20 ENCOUNTER — Other Ambulatory Visit: Payer: Self-pay

## 2013-11-25 LAB — WOUND CULTURE

## 2013-11-26 ENCOUNTER — Inpatient Hospital Stay: Payer: Self-pay | Admitting: Internal Medicine

## 2013-11-26 LAB — COMPREHENSIVE METABOLIC PANEL
ALBUMIN: 2.3 g/dL — AB (ref 3.4–5.0)
ALT: 13 U/L (ref 12–78)
AST: 20 U/L (ref 15–37)
Alkaline Phosphatase: 97 U/L
Anion Gap: 5 — ABNORMAL LOW (ref 7–16)
BILIRUBIN TOTAL: 0.4 mg/dL (ref 0.2–1.0)
BUN: 19 mg/dL — ABNORMAL HIGH (ref 7–18)
CO2: 34 mmol/L — AB (ref 21–32)
Calcium, Total: 10.1 mg/dL (ref 8.5–10.1)
Chloride: 99 mmol/L (ref 98–107)
Creatinine: 4.7 mg/dL — ABNORMAL HIGH (ref 0.60–1.30)
EGFR (African American): 13 — ABNORMAL LOW
EGFR (Non-African Amer.): 11 — ABNORMAL LOW
Glucose: 73 mg/dL (ref 65–99)
Osmolality: 277 (ref 275–301)
Potassium: 4.1 mmol/L (ref 3.5–5.1)
SODIUM: 138 mmol/L (ref 136–145)
Total Protein: 6.9 g/dL (ref 6.4–8.2)

## 2013-11-26 LAB — URINALYSIS, COMPLETE
BILIRUBIN, UR: NEGATIVE
GLUCOSE, UR: NEGATIVE mg/dL (ref 0–75)
KETONE: NEGATIVE
Nitrite: NEGATIVE
Ph: 7 (ref 4.5–8.0)
Protein: >=500
SPECIFIC GRAVITY: 1.013 (ref 1.003–1.030)
Squamous Epithelial: NONE SEEN

## 2013-11-26 LAB — MAGNESIUM: MAGNESIUM: 2.1 mg/dL

## 2013-11-26 LAB — PROTIME-INR
INR: 1.3
PROTHROMBIN TIME: 16 s — AB (ref 11.5–14.7)

## 2013-11-26 LAB — CBC WITH DIFFERENTIAL/PLATELET
BASOS ABS: 0 10*3/uL (ref 0.0–0.1)
BASOS PCT: 0.8 %
Eosinophil #: 0.1 10*3/uL (ref 0.0–0.7)
Eosinophil %: 0.9 %
HCT: 23.7 % — ABNORMAL LOW (ref 40.0–52.0)
HGB: 7.7 g/dL — AB (ref 13.0–18.0)
Lymphocyte #: 1.2 10*3/uL (ref 1.0–3.6)
Lymphocyte %: 18.9 %
MCH: 30.7 pg (ref 26.0–34.0)
MCHC: 32.2 g/dL (ref 32.0–36.0)
MCV: 95 fL (ref 80–100)
MONO ABS: 1.1 x10 3/mm — AB (ref 0.2–1.0)
MONOS PCT: 17.7 %
NEUTROS ABS: 3.9 10*3/uL (ref 1.4–6.5)
Neutrophil %: 61.7 %
PLATELETS: 179 10*3/uL (ref 150–440)
RBC: 2.49 10*6/uL — ABNORMAL LOW (ref 4.40–5.90)
RDW: 18.9 % — ABNORMAL HIGH (ref 11.5–14.5)
WBC: 6.2 10*3/uL (ref 3.8–10.6)

## 2013-11-26 LAB — TROPONIN I: Troponin-I: <0.02 ng/mL

## 2013-11-26 LAB — PHOSPHORUS: Phosphorus: 3.7 mg/dL (ref 2.5–4.9)

## 2013-11-27 LAB — RAPID HIV-1/2 QL/CONFIRM: HIV-1/2, RAPID QL: NEGATIVE

## 2013-11-27 LAB — CBC WITH DIFFERENTIAL/PLATELET
BASOS PCT: 0.6 %
Basophil #: 0 10*3/uL (ref 0.0–0.1)
Eosinophil #: 0.1 10*3/uL (ref 0.0–0.7)
Eosinophil %: 1.7 %
HCT: 22.6 % — ABNORMAL LOW (ref 40.0–52.0)
HGB: 7.2 g/dL — ABNORMAL LOW (ref 13.0–18.0)
Lymphocyte #: 1.2 10*3/uL (ref 1.0–3.6)
Lymphocyte %: 21.8 %
MCH: 30.3 pg (ref 26.0–34.0)
MCHC: 31.9 g/dL — ABNORMAL LOW (ref 32.0–36.0)
MCV: 95 fL (ref 80–100)
Monocyte #: 0.7 x10 3/mm (ref 0.2–1.0)
Monocyte %: 12.8 %
NEUTROS ABS: 3.5 10*3/uL (ref 1.4–6.5)
Neutrophil %: 63.1 %
PLATELETS: 185 10*3/uL (ref 150–440)
RBC: 2.37 10*6/uL — ABNORMAL LOW (ref 4.40–5.90)
RDW: 18.8 % — ABNORMAL HIGH (ref 11.5–14.5)
WBC: 5.5 10*3/uL (ref 3.8–10.6)

## 2013-11-27 LAB — PHOSPHORUS: Phosphorus: 4.1 mg/dL (ref 2.5–4.9)

## 2013-11-27 LAB — URINE CULTURE

## 2013-12-01 ENCOUNTER — Encounter: Payer: Self-pay | Admitting: Surgery

## 2013-12-01 LAB — CULTURE, BLOOD (SINGLE)

## 2013-12-08 ENCOUNTER — Encounter: Payer: Self-pay | Admitting: Surgery

## 2013-12-10 ENCOUNTER — Ambulatory Visit: Payer: Self-pay | Admitting: Vascular Surgery

## 2013-12-31 ENCOUNTER — Inpatient Hospital Stay: Payer: Self-pay | Admitting: Internal Medicine

## 2013-12-31 LAB — URINALYSIS, COMPLETE
BILIRUBIN, UR: NEGATIVE
Bacteria: NONE SEEN
GLUCOSE, UR: NEGATIVE mg/dL (ref 0–75)
KETONE: NEGATIVE
Nitrite: NEGATIVE
PH: 8 (ref 4.5–8.0)
Protein: 100
SPECIFIC GRAVITY: 1.006 (ref 1.003–1.030)
Squamous Epithelial: NONE SEEN

## 2013-12-31 LAB — CBC
HCT: 23.5 % — AB (ref 40.0–52.0)
HGB: 7.6 g/dL — AB (ref 13.0–18.0)
MCH: 30.9 pg (ref 26.0–34.0)
MCHC: 32.1 g/dL (ref 32.0–36.0)
MCV: 96 fL (ref 80–100)
PLATELETS: 120 10*3/uL — AB (ref 150–440)
RBC: 2.44 10*6/uL — ABNORMAL LOW (ref 4.40–5.90)
RDW: 19.9 % — AB (ref 11.5–14.5)
WBC: 5.4 10*3/uL (ref 3.8–10.6)

## 2013-12-31 LAB — COMPREHENSIVE METABOLIC PANEL
ALBUMIN: 2.2 g/dL — AB (ref 3.4–5.0)
ANION GAP: 5 — AB (ref 7–16)
Alkaline Phosphatase: 134 U/L — ABNORMAL HIGH
BILIRUBIN TOTAL: 0.3 mg/dL (ref 0.2–1.0)
BUN: 26 mg/dL — AB (ref 7–18)
CHLORIDE: 105 mmol/L (ref 98–107)
CREATININE: 5.22 mg/dL — AB (ref 0.60–1.30)
Calcium, Total: 9.2 mg/dL (ref 8.5–10.1)
Co2: 32 mmol/L (ref 21–32)
EGFR (Non-African Amer.): 10 — ABNORMAL LOW
GFR CALC AF AMER: 11 — AB
Glucose: 72 mg/dL (ref 65–99)
Osmolality: 286 (ref 275–301)
Potassium: 4.1 mmol/L (ref 3.5–5.1)
SGOT(AST): 18 U/L (ref 15–37)
SGPT (ALT): 14 U/L
Sodium: 142 mmol/L (ref 136–145)
TOTAL PROTEIN: 6.9 g/dL (ref 6.4–8.2)

## 2013-12-31 LAB — AMMONIA: Ammonia, Plasma: <10 mcmol/L (ref 11–32)

## 2013-12-31 LAB — TROPONIN I: Troponin-I: <0.02 ng/mL

## 2014-01-01 LAB — CBC WITH DIFFERENTIAL/PLATELET
Basophil #: 0.1 10*3/uL (ref 0.0–0.1)
Basophil %: 1.2 %
Eosinophil #: 0 10*3/uL (ref 0.0–0.7)
Eosinophil %: 1 %
HCT: 24.4 % — ABNORMAL LOW (ref 40.0–52.0)
HGB: 7.9 g/dL — ABNORMAL LOW (ref 13.0–18.0)
Lymphocyte #: 0.9 10*3/uL — ABNORMAL LOW (ref 1.0–3.6)
Lymphocyte %: 20.8 %
MCH: 31.6 pg (ref 26.0–34.0)
MCHC: 32.4 g/dL (ref 32.0–36.0)
MCV: 97 fL (ref 80–100)
MONOS PCT: 8.6 %
Monocyte #: 0.4 x10 3/mm (ref 0.2–1.0)
Neutrophil #: 3 10*3/uL (ref 1.4–6.5)
Neutrophil %: 68.4 %
Platelet: 136 10*3/uL — ABNORMAL LOW (ref 150–440)
RBC: 2.5 10*6/uL — ABNORMAL LOW (ref 4.40–5.90)
RDW: 19.6 % — AB (ref 11.5–14.5)
WBC: 4.4 10*3/uL (ref 3.8–10.6)

## 2014-01-01 LAB — BASIC METABOLIC PANEL
ANION GAP: 8 (ref 7–16)
BUN: 36 mg/dL — ABNORMAL HIGH (ref 7–18)
CALCIUM: 10.1 mg/dL (ref 8.5–10.1)
CREATININE: 6.86 mg/dL — AB (ref 0.60–1.30)
Chloride: 103 mmol/L (ref 98–107)
Co2: 29 mmol/L (ref 21–32)
EGFR (Non-African Amer.): 7 — ABNORMAL LOW
GFR CALC AF AMER: 8 — AB
GLUCOSE: 85 mg/dL (ref 65–99)
Osmolality: 287 (ref 275–301)
POTASSIUM: 4.7 mmol/L (ref 3.5–5.1)
Sodium: 140 mmol/L (ref 136–145)

## 2014-01-01 LAB — URIC ACID: URIC ACID: 4 mg/dL (ref 3.5–7.2)

## 2014-01-01 LAB — PHOSPHORUS: PHOSPHORUS: 3.7 mg/dL (ref 2.5–4.9)

## 2014-01-03 ENCOUNTER — Encounter: Payer: Self-pay | Admitting: Surgery

## 2014-01-05 LAB — CULTURE, BLOOD (SINGLE)

## 2014-02-03 ENCOUNTER — Encounter: Payer: Self-pay | Admitting: Surgery

## 2014-02-03 ENCOUNTER — Ambulatory Visit: Payer: Self-pay | Admitting: Internal Medicine

## 2014-02-16 ENCOUNTER — Emergency Department: Payer: Self-pay | Admitting: Emergency Medicine

## 2014-02-16 LAB — CBC WITH DIFFERENTIAL/PLATELET
Basophil #: 0 10*3/uL (ref 0.0–0.1)
Basophil %: 0.2 %
EOS ABS: 0.1 10*3/uL (ref 0.0–0.7)
EOS PCT: 0.6 %
HCT: 27.4 % — AB (ref 40.0–52.0)
HGB: 8.6 g/dL — ABNORMAL LOW (ref 13.0–18.0)
LYMPHS ABS: 0.9 10*3/uL — AB (ref 1.0–3.6)
Lymphocyte %: 9.6 %
MCH: 28.1 pg (ref 26.0–34.0)
MCHC: 31.3 g/dL — AB (ref 32.0–36.0)
MCV: 90 fL (ref 80–100)
Monocyte #: 0.9 x10 3/mm (ref 0.2–1.0)
Monocyte %: 9.4 %
Neutrophil #: 7.4 10*3/uL — ABNORMAL HIGH (ref 1.4–6.5)
Neutrophil %: 80.2 %
PLATELETS: 206 10*3/uL (ref 150–440)
RBC: 3.05 10*6/uL — ABNORMAL LOW (ref 4.40–5.90)
RDW: 18.5 % — ABNORMAL HIGH (ref 11.5–14.5)
WBC: 9.2 10*3/uL (ref 3.8–10.6)

## 2014-02-16 LAB — COMPREHENSIVE METABOLIC PANEL
AST: 25 U/L (ref 15–37)
Albumin: 1.8 g/dL — ABNORMAL LOW (ref 3.4–5.0)
Alkaline Phosphatase: 114 U/L
Anion Gap: 9 (ref 7–16)
BUN: 28 mg/dL — ABNORMAL HIGH (ref 7–18)
Bilirubin,Total: 0.4 mg/dL (ref 0.2–1.0)
Calcium, Total: 10 mg/dL (ref 8.5–10.1)
Chloride: 103 mmol/L (ref 98–107)
Co2: 31 mmol/L (ref 21–32)
Creatinine: 5.19 mg/dL — ABNORMAL HIGH (ref 0.60–1.30)
GFR CALC AF AMER: 11 — AB
GFR CALC NON AF AMER: 10 — AB
Glucose: 65 mg/dL (ref 65–99)
Osmolality: 289 (ref 275–301)
POTASSIUM: 4.1 mmol/L (ref 3.5–5.1)
SGPT (ALT): 15 U/L
Sodium: 143 mmol/L (ref 136–145)
Total Protein: 7.2 g/dL (ref 6.4–8.2)

## 2014-02-16 LAB — TROPONIN I: Troponin-I: <0.02 ng/mL

## 2014-02-20 ENCOUNTER — Other Ambulatory Visit: Payer: Self-pay | Admitting: Family Medicine

## 2014-02-21 ENCOUNTER — Other Ambulatory Visit: Payer: Self-pay

## 2014-02-21 ENCOUNTER — Inpatient Hospital Stay: Payer: Self-pay | Admitting: Internal Medicine

## 2014-02-21 LAB — CBC WITH DIFFERENTIAL/PLATELET
Basophil #: 0 10*3/uL (ref 0.0–0.1)
Basophil %: 0.3 %
EOS PCT: 0.6 %
Eosinophil #: 0.1 10*3/uL (ref 0.0–0.7)
HCT: 23.6 % — ABNORMAL LOW (ref 40.0–52.0)
HGB: 7 g/dL — ABNORMAL LOW (ref 13.0–18.0)
Lymphocyte #: 0.6 10*3/uL — ABNORMAL LOW (ref 1.0–3.6)
Lymphocyte %: 4.7 %
MCH: 26.6 pg (ref 26.0–34.0)
MCHC: 29.6 g/dL — AB (ref 32.0–36.0)
MCV: 90 fL (ref 80–100)
MONO ABS: 0.7 x10 3/mm (ref 0.2–1.0)
MONOS PCT: 5.9 %
Neutrophil #: 10.6 10*3/uL — ABNORMAL HIGH (ref 1.4–6.5)
Neutrophil %: 88.5 %
Platelet: 296 10*3/uL (ref 150–440)
RBC: 2.63 10*6/uL — ABNORMAL LOW (ref 4.40–5.90)
RDW: 18.9 % — ABNORMAL HIGH (ref 11.5–14.5)
WBC: 11.9 10*3/uL — ABNORMAL HIGH (ref 3.8–10.6)

## 2014-02-21 LAB — COMPREHENSIVE METABOLIC PANEL
ALK PHOS: 114 U/L
ANION GAP: 11 (ref 7–16)
Albumin: 1.7 g/dL — ABNORMAL LOW (ref 3.4–5.0)
BUN: 12 mg/dL (ref 7–18)
Bilirubin,Total: 0.3 mg/dL (ref 0.2–1.0)
CO2: 29 mmol/L (ref 21–32)
CREATININE: 2.55 mg/dL — AB (ref 0.60–1.30)
Calcium, Total: 9.7 mg/dL (ref 8.5–10.1)
Chloride: 103 mmol/L (ref 98–107)
EGFR (African American): 26 — ABNORMAL LOW
EGFR (Non-African Amer.): 23 — ABNORMAL LOW
Glucose: 124 mg/dL — ABNORMAL HIGH (ref 65–99)
Osmolality: 286 (ref 275–301)
Potassium: 3.3 mmol/L — ABNORMAL LOW (ref 3.5–5.1)
SGOT(AST): 48 U/L — ABNORMAL HIGH (ref 15–37)
SGPT (ALT): 27 U/L
Sodium: 143 mmol/L (ref 136–145)
Total Protein: 7.4 g/dL (ref 6.4–8.2)

## 2014-02-21 LAB — MAGNESIUM: Magnesium: 1.7 mg/dL — ABNORMAL LOW

## 2014-02-21 LAB — LIPASE, BLOOD: Lipase: 127 U/L (ref 73–393)

## 2014-02-21 LAB — IRON AND TIBC
IRON BIND. CAP.(TOTAL): 123 ug/dL — AB (ref 250–450)
Iron Saturation: 23 %
Iron: 28 ug/dL — ABNORMAL LOW (ref 65–175)
Unbound Iron-Bind.Cap.: 95 ug/dL

## 2014-02-21 LAB — HEMOGLOBIN: HGB: 7.1 g/dL — ABNORMAL LOW (ref 13.0–18.0)

## 2014-02-21 LAB — FERRITIN: Ferritin (ARMC): 2201 ng/mL — ABNORMAL HIGH (ref 8–388)

## 2014-02-22 LAB — HEMOGLOBIN
HGB: 7.4 g/dL — ABNORMAL LOW (ref 13.0–18.0)
HGB: 7.8 g/dL — ABNORMAL LOW (ref 13.0–18.0)

## 2014-02-23 LAB — CBC WITH DIFFERENTIAL/PLATELET
Basophil #: 0 10*3/uL (ref 0.0–0.1)
Basophil %: 0.3 %
EOS ABS: 0.1 10*3/uL (ref 0.0–0.7)
Eosinophil %: 1.1 %
HCT: 24.5 % — AB (ref 40.0–52.0)
HGB: 7.6 g/dL — ABNORMAL LOW (ref 13.0–18.0)
LYMPHS PCT: 12.4 %
Lymphocyte #: 1.1 10*3/uL (ref 1.0–3.6)
MCH: 27.2 pg (ref 26.0–34.0)
MCHC: 30.9 g/dL — AB (ref 32.0–36.0)
MCV: 88 fL (ref 80–100)
MONO ABS: 0.6 x10 3/mm (ref 0.2–1.0)
Monocyte %: 6.7 %
NEUTROS PCT: 79.5 %
Neutrophil #: 7.3 10*3/uL — ABNORMAL HIGH (ref 1.4–6.5)
PLATELETS: 292 10*3/uL (ref 150–440)
RBC: 2.78 10*6/uL — AB (ref 4.40–5.90)
RDW: 18 % — AB (ref 11.5–14.5)
WBC: 9.2 10*3/uL (ref 3.8–10.6)

## 2014-03-05 ENCOUNTER — Ambulatory Visit: Payer: Self-pay | Admitting: Internal Medicine

## 2014-03-10 ENCOUNTER — Emergency Department: Payer: Self-pay | Admitting: Emergency Medicine

## 2014-03-10 LAB — COMPREHENSIVE METABOLIC PANEL
ALBUMIN: 1.2 g/dL — AB (ref 3.4–5.0)
ALT: 18 U/L
Alkaline Phosphatase: 114 U/L
Anion Gap: 6 — ABNORMAL LOW (ref 7–16)
BILIRUBIN TOTAL: 0.4 mg/dL (ref 0.2–1.0)
BUN: 15 mg/dL (ref 7–18)
Calcium, Total: 8.1 mg/dL — ABNORMAL LOW (ref 8.5–10.1)
Chloride: 106 mmol/L (ref 98–107)
Co2: 29 mmol/L (ref 21–32)
Creatinine: 2.7 mg/dL — ABNORMAL HIGH (ref 0.60–1.30)
EGFR (African American): 29 — ABNORMAL LOW
EGFR (Non-African Amer.): 24 — ABNORMAL LOW
GLUCOSE: 44 mg/dL — AB (ref 65–99)
Osmolality: 279 (ref 275–301)
Potassium: 3.8 mmol/L (ref 3.5–5.1)
SGOT(AST): 40 U/L — ABNORMAL HIGH (ref 15–37)
Sodium: 141 mmol/L (ref 136–145)
Total Protein: 6 g/dL — ABNORMAL LOW (ref 6.4–8.2)

## 2014-03-10 LAB — CBC
HCT: 22.7 % — ABNORMAL LOW (ref 40.0–52.0)
HGB: 6.7 g/dL — AB (ref 13.0–18.0)
MCH: 26.1 pg (ref 26.0–34.0)
MCHC: 29.7 g/dL — ABNORMAL LOW (ref 32.0–36.0)
MCV: 88 fL (ref 80–100)
Platelet: 53 10*3/uL — ABNORMAL LOW (ref 150–440)
RBC: 2.59 10*6/uL — ABNORMAL LOW (ref 4.40–5.90)
RDW: 19.6 % — ABNORMAL HIGH (ref 11.5–14.5)
WBC: 7.1 10*3/uL (ref 3.8–10.6)

## 2014-03-10 LAB — TROPONIN I: Troponin-I: <0.02 ng/mL

## 2014-04-05 ENCOUNTER — Ambulatory Visit: Payer: Self-pay | Admitting: Internal Medicine

## 2014-04-05 DEATH — deceased

## 2014-09-22 NOTE — Op Note (Signed)
PATIENT NAME:  Ronald [name], Ronald [name] MR#:  [id number] DATE OF BIRTH:  1930-12-11  DATE OF PROCEDURE:  04/15/2012  PREOPERATIVE DIAGNOSES:  1. End-stage renal disease.  2. Difficult dialysis access with current central venous catheter.  3. Stroke.   POSTOPERATIVE DIAGNOSES:  1. End-stage renal disease.  2. Difficult dialysis access with current central venous catheter.  3. Stroke.   PROCEDURES:  1. Ultrasound guidance for vascular access, left brachial vein.  2. Left upper extremity venogram and central venogram.   SURGEON: Annice NeedyJason S. Fonnie Crookshanks, MD  ANESTHESIA: Local with moderate conscious sedation.   ESTIMATED BLOOD LOSS: Minimal.   FLUOROSCOPY TIME: Less than one minute.   CONTRAST USED: 20 mL.   INDICATION FOR PROCEDURE: This is [name] gentleman with long-standing end-stage renal disease. He has [name] left IJ PermCath. He has failed dialysis access in his right arm. His ultrasound showed no good superficial veins on the left and [name] venogram is recommended to evaluate his upper extremity veins for graft creation in the central veins to see if central venous stenosis is present. Risks and benefits were discussed. Informed consent was obtained.   DESCRIPTION OF PROCEDURE: Patient was brought to the vascular interventional radiology suite. Left upper extremity was sterilely prepped and draped, sterile surgical field was created. The left brachial vein was visualized with ultrasound and found to be widely patent. It was then accessed under direct ultrasound guidance without difficulty with [name] micropuncture needle, micropuncture wire and sheath were placed. Imaging was performed through the micropuncture sheath. This demonstrated patent brachial vein, axillary vein, subclavian vein. The left innominate vein and superior vena cava were all patent with the left IJ PermCath traversing through the innominate vein and superior vena cava. There were no focal stenoses identified with good brisk flow. At this point I elected  to terminate the procedure. The micropuncture sheath was removed. Pressure was held. Sterile dressing was placed. The patient tolerated procedure well and was taken to the recovery room in stable condition.   ____________________________ Annice NeedyJason S. Issaic Welliver, MD jsd:cms D: 04/15/2012 10:32:02 ET T: 04/15/2012 11:17:52 ET JOB#: [id number]  cc: Annice NeedyJason S. Johnathin Vanderschaaf, MD, <Dictator> Annice NeedyJASON S Hatice Bubel MD ELECTRONICALLY SIGNED 04/15/2012 13:30

## 2014-09-22 NOTE — Op Note (Signed)
PATIENT NAME:  Ronald HeaterOTEAT, Nilson A MR#:  [id number] DATE OF BIRTH:  04/14/1931  DATE OF PROCEDURE:  02/12/2012  PREOPERATIVE DIAGNOSES:  1. End-stage renal disease.  2. Poorly functioning right arm AV graft.  3. Stroke.  4. Hypertension.   POSTOPERATIVE DIAGNOSES:  1. End-stage renal disease.  2. Poorly functioning right arm AV graft.  3. Stroke.  4. Hypertension.   PROCEDURES:  1. Ultrasound guidance for vascular access to right arm AV graft.  2. Right upper extremity shuntogram and central venogram.  3. Percutaneous transluminal angioplasty of graft stenosis on venous side with 7 and 8 mm diameter angioplasty balloon.  4. Percutaneous transluminal angioplasty of venous anastomotic stenosis with 8 mm diameter angioplasty balloon.  5. Percutaneous transluminal angioplasty of brachial vein and axillary vein with 8 mm diameter angioplasty balloon for recurrent stenosis.   SURGEON: Annice NeedyJason S. Khaliya Golinski, MD  ANESTHESIA: Local with moderate conscious sedation.   ESTIMATED BLOOD LOSS: 25 mL.   FLUOROSCOPY TIME: Approximately 10 minutes.   CONTRAST USED: 46 mL.   INDICATION FOR PROCEDURE: This is a gentleman with multiple medical issues and end-stage renal disease. His graft has been functioning poorly and we are asked to evaluate this. Noninvasive studies demonstrated significant areas of narrowing within the graft and in the brachial vein and axillary vein upstream and areas of previous intervention.   DESCRIPTION OF PROCEDURE: The patient was brought to the vascular interventional radiology suite. Right upper extremity was sterilely prepped and draped, a sterile surgical field was created. The graft was accessed in its midportion with a micropuncture needle. Micropuncture wire and sheath were placed. We upsized to a 6 [place] sheath. Imaging showed some in-graft stenosis from within the venous access site. The venous anastomosis had stents across it with moderate stenosis at the venous anastomosis in  the brachial vein. I counted three stents in this location on up to the upper arm brachial vein then there was a gap of several centimeters and two more stents that were placed in the brachial vein to the axillary vein. In the brachial axillary vein there was an area of moderate to high-grade stenosis within the stent and at the distal portion and the stent. The central venous circulation was then patent. Patient was systemically heparinized. I crossed these lesions with a moderate amount of difficulty and having to use a Glidewire and a Kumpe catheter and not just the Magic torque wire. Once I then got access with the Kumpe catheter I replaced the Magic torque wire. The most central stenosis in the axillary vein was treated in an 8 mm diameter angioplasty balloon with good angiographic completion result. The area in the brachial vein and venous anastomosis was treated with an 8 mm diameter angioplasty balloon with good angiographic completion result. The graft was treated with a 7 and then an 8 mm diameter angioplasty balloon with mild residual stenosis after angioplasty that was not flow limiting. At this point the graft seemed to have a markedly improved flow and I elected to terminate the procedure. The sheath was removed around a 4-0 Monocryl pursestring suture. Pressure was held. Sterile dressing was placed. The patient tolerated the procedure well and was taken to the recovery room in stable condition having done well.   ____________________________ Annice NeedyJason S. Jos Cygan, MD jsd:cms D: 02/12/2012 14:14:58 ET T: 02/12/2012 15:12:54 ET JOB#: [id number]  cc: Annice NeedyJason S. Seanpaul Preece, MD, <Dictator> Annice NeedyJASON S Bence Trapp MD ELECTRONICALLY SIGNED 02/19/2012 8:59

## 2014-09-22 NOTE — Op Note (Signed)
PATIENT NAME:  Ronald HeaterOTEAT, Martino A MR#:  [id number] DATE OF BIRTH:  02-06-1931  DATE OF PROCEDURE:  04/24/2012  PREOPERATIVE DIAGNOSIS:  1. End-stage renal disease.  2. Stroke.  3. Hypertension.   POSTOPERATIVE DIAGNOSES:  1. End-stage renal disease.  2. Stroke.  3. Hypertension.   PROCEDURES: Left loop forearm AV graft.   SURGEON: Annice NeedyJason S. Dew, MD   ANESTHESIA: General.   ESTIMATED BLOOD LOSS: Minimal.   INDICATION FOR PROCEDURE: The patient is a 81 year old African American male with end-stage renal disease. He is currently catheter dependent. He has no good use of superficial vein for fistula creation and an AV graft in the left arm is planned.   DESCRIPTION OF PROCEDURE: The patient was brought to the operative suite and after an adequate level of general anesthesia was obtained the left upper extremity was sterilely prepped and draped and a sterile surgical field was created. A small transverse incision was created at the antecubital fossa. If his brachial vein was of good size, an option for loop forearm AV graft was planned. If not, a brachial artery to axillary vein ASV graft was planned. On exploration, the left brachial vein was actually a good size and was usable for graft creation. The brachial artery was also an acceptable artery with only mild disease. These were encircled with vessel loops proximally and distally. A tunnel was created and a 6 mm Gore PTFE graft was selected. This was a non-heparin coated graft due to his presumed heparin allergy and only normal saline was used. The patient was then anticoagulated with Angiomax and the graft was cut to proper orientation. The arterial anastomosis was created first after control was pulled up on the artery. An anterior arteriotomy was created with an 11 blade with Potts scissors. The graft was then cut and beveled to match our arteriotomy. Anastomosis was created with a running CV-6 suture. I flushed through the graft with excellent  pulsatile inflow. The vein was then controlled with bulldog clamps. An anterior wall venotomy was created with an 11 blade and extended with Potts scissors and the graft was cut and beveled to an appropriate length to match the venotomy. The venous anastomosis was also performed with a running CV-6 suture in the usual fashion. The vessel was flushed and de-aired prior to release of control. On release there was excellent pulsatile flow through the graft. There was some needle hole bleeding that was controlled with several minutes of local pressure. The counterincision in the forearm was closed with a 3-0 Vicryl and 4-0 Vicryl. Surgicel and Evicyl topical hemostatic agents were placed overlying the antecubital incision and hemostasis was achieved. I then closed the wound with a running 3-0 Vicryl and a running 4-0 Monocryl. Dermabond was placed as a dressing. The patient tolerated the procedure well and was taken to the recovery room in stable condition.   ____________________________ Annice NeedyJason S. Dew, MD jsd:drc D: 04/24/2012 15:19:28 ET T: 04/24/2012 15:51:14 ET JOB#: [id number]  cc: Annice NeedyJason S. Dew, MD, <Dictator> Annice NeedyJASON S DEW MD ELECTRONICALLY SIGNED 04/29/2012 11:46

## 2014-09-25 NOTE — Op Note (Signed)
PATIENT NAME:  Ronald [name], Ronald [name] MR#:  [id number] DATE OF BIRTH:  06/04/1931  DATE OF PROCEDURE:  12/04/2012  PREOPERATIVE DIAGNOSES:  1. Complication of arteriovenous fistula.  2. End-stage renal disease requiring hemodialysis.   POSTOPERATIVE DIAGNOSES:   1. Complication of arteriovenous fistula.  2. End-stage renal disease requiring hemodialysis.   PROCEDURES PERFORMED:  1. Contrast injection of left arm basilic transposition.  2. Percutaneous transluminal angioplasty of the confluence of the basilic and axillary veins to 8 mm.   SURGEON: Katha Cabal, M.D.   SEDATION: Versed 4 mg plus fentanyl 150 mcg administered IV. Continuous ECG, pulse oximetry and cardiopulmonary monitoring is performed throughout the entire procedure by the interventional radiology nurse. Total sedation time is 50 minutes.   ACCESS: A 6-French sheath left arm basilic transposition.   CONTRAST USED: Isovue 28 mL.   FLUOROSCOPY TIME: 1.3 minutes.   INDICATIONS: Ronald [name] is an 81 year old gentleman currently maintained with a dialysis catheter who is undergoing creation of a basilic transposition. Followup imaging demonstrated a high-grade stricture, and he is, therefore, undergoing evaluation with the hope for intervention. Risks and benefits were reviewed. All questions answered. The patient agrees to proceed.   DESCRIPTION OF PROCEDURE: The patient is taken to special procedures and placed in the supine position with his left arm extended palm upward. His left arm is prepped and draped in a sterile fashion. Lidocaine 1% is infiltrated in the soft tissues overlying thee fistula, and a micropuncture needle is inserted in a retrograde direction. This puncture is more proximal on the arm. Microwire is advanced, and a 5-French microsheath is inserted. Hand injection of contrast is then utilized to demonstrate the fistula, and this demonstrates that the stricture is indeed present. It is a string sign. It is  actually located more proximal, and I am, therefore, in the wrong orientation. A pursestring suture is then placed around the 5-French microsheath and it is removed. A micropuncture kit is then used to access the fistula in an antegrade direction more distally on the arm and the wire advanced, followed by microsheath, followed by a J-wire, followed by a 6-French sheath.   The patient has a HEPARIN ALLERGY, and, therefore, one-third bolus of Angiomax is given. Magic Torque wire is advanced, and subsequently a 7 x 4 and then an 8 x 4 balloon are used to dilate this area. Followup angiography demonstrates near complete resolution of the stricture with a vessel diameter that now matches both the fistula and the axillary vein. Therefore, no stenting is performed.   A pursestring suture is placed around the 6-French sheath, 6-French sheath is removed, light pressure is held and there are no immediate complications.   INTERPRETATION: As noted above, initial views demonstrate the fistula, itself, appears to be in good condition with a smooth contour. It is widely patent until the confluence with the brachial and axillary veins. Therefore, at that point, there is a greater than 95% stenosis over a distance of approximately 3 cm. The axillary, subclavian, innominate and superior vena cava are widely patent.   Following angioplasty, there is essentially resolution of this lesion, and, therefore, no stenting is performed.   ____________________________ Katha Cabal, MD ggs:gb D: 12/04/2012 18:48:36 ET T: 12/05/2012 02:24:56 ET JOB#: [id number]  cc: Katha Cabal, MD, <Dictator> Katha Cabal MD ELECTRONICALLY SIGNED 12/10/2012 14:31

## 2014-09-25 NOTE — Op Note (Signed)
PATIENT NAME:  Ronald [name], Ronald [name] MR#:  [id number] DATE OF BIRTH:  June 05, 1931  DATE OF PROCEDURE:  07/15/2012  PREOPERATIVE DIAGNOSES: 1.  End-stage renal disease.  2.  Poorly functioning left arm arteriovenous graft with prolonged bleeding and pseudoaneurysms.  3.  Hypertension. 4.  Stroke.  POSTOPERATIVE DIAGNOSIS:  1.  End-stage renal disease.  2.  Poorly functioning left arm arteriovenous graft with prolonged bleeding and pseudoaneurysms.  3.  Hypertension. 4.  Stroke.  PROCEDURES: 1.  Ultrasound guidance for vascular access to left arm arteriovenous graft.  2.  Left upper extremity shuntogram.  3.  Percutaneous transluminal angioplasty of venous anastomosis and the brachial vein with 7 mm diameter angioplasty balloon.   SURGEON: Annice NeedyJason S Dew, M.D.   ANESTHESIA: Local with moderate conscious sedation.   ESTIMATED BLOOD LOSS: 25 mL.  CONTRAST USED:   20 mL Visipaque.   INDICATION FOR PROCEDURE: The patient is [name] 81 year old African American male with end-stage renal disease. His graft in his left arm has had prolonged bleeding with significant bruising and signs of pseudoaneurysm. Shuntogram was performed for further evaluation.  The risks and benefits were discussed. Informed consent was obtained.   DESCRIPTION OF PROCEDURE: The patient is brought to the vascular interventional radiology suite. The left upper extremity was sterilely prepped and draped, [name] sterile surgical field was created. The graft was accessed near its apex.  Under direct ultrasound guidance without difficulty with [name] micropuncture needle, [name] micropuncture wire and sheath were placed and upsized to [name] 6 [place]French sheath. Imaging performed through the sheath showed [name] long tapered stenosis in the brachial vein over several centimeters beyond the anastomosis that was high-grade nature with narrowing at the anastomosis itself.  I crossed the lesion without difficulty with [name] Magic torque wire and treated the lesion with [name] 7 mm  diameter angioplasty balloon with reasonably good angiographic result and less than 30% residual stenosis.  While the balloon was inflated imaging was performed of the graft. The graft itself was patent. The arterial anastomosis was patent. There were several small pseudoaneurysms that were present with the balloon inflated, and likely developed from the high venous pressure.   At this point, I elected to terminate the procedure.  The sheath was removed around [name] 4-0 Monocryl suture. Pressure was held. Sterile dressing was placed. The patient tolerated the procedure well and was taken to the recovery room in stable condition.     ____________________________ Annice NeedyJason S. Dew, MD jsd:ct D: 07/15/2012 14:14:23 ET T: 07/15/2012 14:35:07 ET JOB#: [id number]  cc: Annice NeedyJason S. Dew, MD, <Dictator> Annice NeedyJASON S DEW MD ELECTRONICALLY SIGNED 07/22/2012 16:57

## 2014-09-25 NOTE — Consult Note (Signed)
Brief Consult Note: Diagnosis: schizoaffective disorder.   Patient was seen by consultant.   Consult note dictated.   Recommend further assessment or treatment.   Orders entered.   Discussed with Attending MD.   Comments: Psychiatry: Patient seen. Hx of schizoaffective disorder. Very confused and agitated and unable to engage in conversation rationally. hostile. Due to age,behavior and self care needs can not be admitted to our inpatient psych unit. I suggest stat dose of zyprexa and depakote now and then reattempt dialysis here.  Electronic Signatures: Audery Amellapacs, Howard T (MD)  (Signed [phone]-Nov-14 14:32)  Authored: Brief Consult Note   Last Updated: 12-Nov-14 14:32 by Audery Amellapacs, Lynard T (MD)

## 2014-09-25 NOTE — Op Note (Signed)
PATIENT NAME:  Ronald HeaterOTEAT, Ronald [name] MR#:  [id number] DATE OF BIRTH:  [birth year]  DATE OF PROCEDURE:  06/10/2012  PREOPERATIVE DIAGNOSES:  1. End-stage renal disease.  2. Functional left arm arteriovenous graft.  POSTOPERATIVE DIAGNOSES:  1. End-stage renal disease.  2. Functional left arm arteriovenous graft.  PROCEDURE: Removal of PermCath.   SURGEONS:  1. Annice NeedyJason S. Shaw Dobek, MD 2. Chelsea N. Hanne, PA-C  ANESTHESIA: Local.   ESTIMATED BLOOD LOSS: Minimal.   INDICATION FOR PROCEDURE: [name] 81 year old African American male with end-stage renal disease. He has permanent dialysis access and his catheter can be removed.   DESCRIPTION OF THE PROCEDURE: The patient was brought to the vascular interventional radiology area. The left neck and chest were sterilely prepped and draped, and [name] sterile surgical field was created. The area was anesthetized copiously with 1% lidocaine. Hemostats were used to help dissect out the cuff and free it from the fibrous sheath. The catheter was then removed in its entirety with gentle traction. Pressure was held and sterile dressing was placed. The patient tolerated the procedure well and was taken to the recovery room in stable condition.     ____________________________ Annice NeedyJason S. Bianey Tesoro, MD jsd:es D: 06/10/2012 09:25:56 ET T: 06/10/2012 09:36:01 ET JOB#: [id number]  cc: Annice NeedyJason S. Jayleena Stille, MD, <Dictator> Annice NeedyJASON S Vylette Strubel MD ELECTRONICALLY SIGNED 06/10/2012 14:15

## 2014-09-25 NOTE — H&P (Signed)
PATIENT NAME:  Ronald [name], Ronald [name] MR#:  [id number] DATE OF BIRTH:  1930/09/16  DATE OF ADMISSION:  04/15/2013  ADMITTING PHYSICIAN: Enid Baas, M.D.   PRIMARY CARE PHYSICIAN: Dr. Ananias Pilgrim at [facility]   CHIEF COMPLAINT: Brought in on April 15, 2013 for acute psychotic symptoms.   HISTORY OF PRESENT ILLNESS: Ronald [name] is an 81 year old African American male with past medical history significant for schizoaffective disorder, end-stage renal disease on hemodialysis, hypothyroidism, chronic constipation, anemia of chronic disease, who has been a resident of [facility], who was brought in 04/15/2013 to the Emergency Room secondary to acute psychotic symptoms and also refusing dialysis the whole last week.   The patient is very delusional and hyperreligious, agitated, paranoid, and he thinks he is Jesus and he is not supposed to be here and needs to go from town to town to see his people. He is not able to provide any history, and most of the history is obtained from the ER physician and also the old records. The patient has been followed by psychiatrist, Dr. Griselda Miner, at [facility].   When he was brought on the 11th, he was seen by Dr. Toni Amend from psychiatry department and his medications were adjusted. Dr. Toni Amend was hoping that if his symptoms will get better controlled, he could be discharged back to [facility]. The patient's agitation has improved a little bit with increasing his olanzapine; however, he is still delusional and has periods of agitation.   The plan was to send him back to [facility] today as dialysis accepted to dialyze him tomorrow as an outpatient. However, transportation could not be arranged, and the patient is being admitted to medical service for possible dialysis tomorrow and then discharged back to [facility].   PAST MEDICAL HISTORY:  1.  Schizoaffective disorder.  2.  Hypertension.  3.  Hypothyroidism.  4.  End-stage renal disease  on Tuesday, Thursday, Saturday hemodialysis.  5.  Anemia of chronic disease.   ALLERGIES: HEPARIN IS BEING LISTED.   CURRENT MEDICATIONS:  1.  Duragesic patch 50 mcg q. 3 days.  2.  Xalatan ophthalmic solution in the eyes.  3.  Zyprexa 5 mg p.o. b.i.d.  4.  Zyprexa 20 mg daily.  5.  Ativan 0.5 mg p.o. q. 6 hours p.r.n.  6.  Aspirin 81 mg p.o. daily.  7.  Benzatropine 0.5 mg p.o. b.i.d. p.r.n.  8.  Benzatropine 1 mg p.o. b.i.d.  9.  Claritin 10 mg p.o. daily.  10.  Depakote 500 mg at bedtime.  11.  Ferrous gluconate 324 mg daily.  12.  Haldol 1 mg q. 4 hours p.r.n.  13.  Haldol 5 mg p.o. b.i.d.  14.  Lac-Hydrin 12% topical cream at bedtime to dry feet.  15.  Lactulose 30 mL b.i.d. on Sundays and 30 mL q. daily from Monday through Saturday.  16.  Synthroid 25 mcg p.o. daily.  17.  Lidocaine topical cream to left arm shunt on Tuesday, Thursday, Saturday with dialysis.  18.  Lidoderm patch 12 hours on and 12 hours off.  19.  Lipitor 20 mg p.o. daily.  20.  Lorazepam 1 mg p.o. b.i.d.  21.  Midodrin 10 mg on Tuesday, Thursday, Saturday 30 minutes after dialysis  22.  Nephro-Vite 1 tablet p.o. daily.  23.  Norco 5/325 mg p.o. q. 4 hours p.r.n. for pain.  24.  Renvela 800 mg tablet 3 tablets 3 times a day. 25.  Senna Colace 2 tablets p.o. b.i.d.  26.  Systane ophthalmic solution to each affected eye twice a day.   SOCIAL HISTORY: No family at this time, the patient has been a resident [facility]. No history of any smoking or alcohol.   FAMILY HISTORY: Not known at this time   REVIEW OF SYSTEMS: Difficult to be obtained as the patient is too disorganized and delusional at this time.   PHYSICAL EXAMINATION:  VITAL SIGNS: Temperature 97.6 degrees Fahrenheit, pulse 81, respirations 18, blood pressure 150/70, pulse oximetry 98% on room air.  GENERAL: Well-nourished, well-built, disheveled-appearing male sitting in bed, not in any acute distress.  HEENT: Muddy conjunctivae. Pupils  equal, round, reacting to light. Anicteric sclerae. Extraocular movements intact. Oropharynx clear without erythema, mass or exudates.  NECK: Supple. No thyromegaly, JVD or carotid bruits. No lymphadenopathy.  LUNGS: Moving air bilaterally. Decreased bibasilar breath sounds. No wheeze or crackles. No use of accessory muscles for breathing.  CARDIOVASCULAR: S1, S2 regular rate and rhythm. No murmurs, rubs or gallops.  ABDOMEN: Soft, nontender, nondistended. No hepatosplenomegaly. Normal bowel sounds.  EXTREMITIES: Show 1+ pedal edema. No clubbing or cyanosis, 2+ dorsalis pedis pulses palpable bilaterally.  SKIN: No acne, rash or lesions.  LYMPHATICS: No cervical lymphadenopathy.  NEUROLOGIC: Cranial nerves seem to be intact. The patient did not cooperate for complete neuro exam. He seems to be moving his upper arms okay. He is noted to have myoclonic jerks, especially of his left arm. Lower extremities: He is able to move against gravity and bend his knees.  PSYCHOLOGICAL: Seems to be alert but disorganized and delusional at this time.   LABORATORY DATA: Sodium today is 141. Potassium 5.0, chloride 106, bicarb 31, BUN 72, creatinine 10.13, glucose 72 and calcium of 9.2. Urine tox screen is negative. Urinalysis negative for any infection.   Chest x-ray showing dialysis catheter. No acute cardiopulmonary disease.   Degenerative changes in shoulder seen.   Serum alcohol level is negative. Salicylate level is negative. TSH within normal limits at 2.06.   ASSESSMENT AND PLAN: An 81 year old male with end-stage renal disease, schizoaffective disorder who was sent in for acute psychosis with mania symptoms 2 days ago, however, seen by Dr. Toni Amendlapacs in the Emergency Room and medications are being adjusted and the plan was to send him back to [facility] today; however, transportation could not be arranged and he is being admitted to medical service.  1.  End-stage renal disease on hemodialysis.  Nephrology consulted. He refused dialysis all last week. Will see if he can get dialyzed tomorrow. Continue Renvela and put on a renal diet.  2.  Acute psychosis with mania with history of schizoaffective disorder. Appreciate psychiatric consult. Will re-consult Dr. Toni Amendlapacs while he is under medical service associated. His medications were being adjusted. Zyprexa dose was increased. Will continue that. He is already on Haldol and Ativan p.r.n. He could benefit from going to a geriatric psychiatric unit.  3.  Hypothyroidism. Continue home medications.  4.  Anemia of chronic disease, appears stable at this time.  5.  Deep vein thrombosis prophylaxis with thromboembolic disease stockings and sequential compression devices.   CODE STATUS: FULL CODE.   TIME SPENT ON ADMISSION: 50 minutes.   ____________________________ Enid Baasadhika Jefrey Raburn, MD rk:np D: 04/17/2013 18:04:00 ET T: 04/17/2013 20:42:04 ET JOB#: [id number]  cc: Ananias Pilgrimarol Smith, MD Enid Baasadhika Coti Burd, MD, <Dictator>    Enid BaasADHIKA Ota Ebersole MD ELECTRONICALLY SIGNED 04/18/2013 17:00

## 2014-09-25 NOTE — Consult Note (Signed)
   Comments   I spoke with pt's sister, Ronna PolioMary Bozeman, by phone. Updated her on pt's current condition. Sister plans for pt to return [facility] at discharge.  discussed code status with sister. Sister says that pt has "suffered enough" and she wants him to be a DNR. Order entered. Out-of-facility DNR completed and placed in chart.   Electronic Signatures: Boby Eyer, Harriett SineNancy (MD)  (Signed [id number]-Nov-14 11:04)  Authored: Palliative Care   Last Updated: 17-Nov-14 11:04 by Jodel Mayhall, Harriett SineNancy (MD)

## 2014-09-25 NOTE — Consult Note (Signed)
PATIENT NAME:  Ronald [name], Ronald [name] MR#:  [id number] DATE OF BIRTH:  11-Jul-1930  DATE OF CONSULTATION:  04/18/2013  CONSULTING PHYSICIAN:  Audery Amel, MD  IDENTIFYING INFORMATION AND REASON FOR CONSULTATION: An 81 year old man with a history of schizoaffective disorder who had been sent to the hospital because he was refusing dialysis. Consultation for psychiatric management.   HISTORY OF PRESENT ILLNESS: History is all similar to what it had been when I did an Emergency Room consult 2 days ago. This 81 year old gentleman, who resides at [facility], is on chronic dialysis. Apparently, he had recently become more agitated and uncooperative. He apparently stated a refusal of dialysis. He was evaluated by his regular outpatient psychiatrist, Dr. Omelia Blackwater, who made a suggestion that perhaps the patient should be in a hospital. They brought him here to the Emergency Room. When I saw him initially, the patient was quite a bit more agitated and argumentative. Almost impossible to hold even a basic conversation with him. On review, it looks like he was on lower doses of medications than  what had previously kept him stable. He had recently been switched over to haloperidol. Zyprexa doses were lower than what they have been previously. Depakote doses were a little lower than what they had been previously. The patient, himself, was not able to offer much information. He did not refuse dialysis when I spoke to him, but rather did not even let me bring the topic up. On interview today, the patient is quite a bit different. He is calm and cooperative. He continues to be psychotic and tells me up front that he is [employer], but he also discusses his dialysis and his medical problems in a fairly straight forward manner. He tells me that he had dialysis today and that he is fine and glad that he cooperated with it. He is complaining of having some pain still in his left foot and notes that he is still a little bit more  swollen than baseline. He is not reporting any suicidal or homicidal ideation. Not reporting any threatening ideas. He tells me that he lives at [facility] and he would like to get back there as soon as possible.   PAST PSYCHIATRIC HISTORY: Long history of chronic psychosis. Has been on multiple medicines over the years. From what I can see in our chart, it looks like the combination of Zyprexa and Depakote has been generally pretty helpful for him. Since being on dialysis, dosing has been a little bit more complicated.  He does not appear to have a clear history of suicidality. Gets wound up when he is psychotic, but I do not see that he has ever been terribly violent.   SOCIAL HISTORY: Lives at [facility]. Does not seem to have lot of family or other social contact.   PAST MEDICAL HISTORY: End-stage renal disease on dialysis. He has dyslipidemia, chronic constipation and also chronic liver insufficiency, hypothyroidism, some chronic pain, allergies.  SUBSTANCE ABUSE HISTORY:  Nothing recent or relevant.  FAMILY HISTORY: None identified.   REVIEW OF SYSTEMS:  He complains that his left foot hurts. Also says that he feels a little bit more swollen all over. He says he ate today and his appetite was okay. He slept okay. Denies suicidal or homicidal ideation. Does not discuss hallucinations. Still clearly delusional. The rest  of the review of systems was generally negative.    CURRENT MEDICATIONS: As of today, he is getting Lipitor 20 mg per  day, Cogentin 1 mg twice a day, Depakote 500 mg at night, haloperidol 5 mg twice a day, lactulose 20 grams a day, Synthroid 25 mcg a day, lidocaine patch during the daytime, Claritin 10 mg a day, Ativan 1 mg twice a day, olanzapine 20 mg at night and Zydis 5 mg 3 times a day p.r.n., pantoprazole  40 mg a day, sevelamer 2400 mg 3 times a day, aspirin 81 mg  per day, Epogen injection with dialysis.   ALLERGIES: HEPARIN.   MENTAL STATUS EXAMINATION:  Disheveled gentleman, looks his stated age. Alert and awake when I came into the room, made good eye contact. Speech was a little bit slurred, slightly difficult to understand, decreased in total amount. Affect flat. Mood stated as fine. Thoughts disorganized when he is given free opportunity to talk, and he is fixed on the idea that he is [employer], but he is otherwise able to hold a fairly straightforward conversation about simple topics, like his medical condition. Denies suicidal or homicidal ideation. Basically cooperative with treatment right now. Cognitively impaired chronically. Alert and oriented to his situation.   ASSESSMENT: This is an 81 year old man with schizoaffective disorder. Given his chronic history and condition, it is unrealistic to think that he would ever be free of psychotic symptoms. The goal is more to make him comfortable enough that he is not agitated and that it is possible to take care of his medical condition. Right now, that seems to be accomplished. He got dialysis today, and he is much calmer. I think that psychiatrically we are at baseline.   TREATMENT PLAN: I would continue his current medications the way they are. I recommend that he be discharged to [facility] at the earliest opportunity. He can be followed up psychiatrically by Dr. Omelia BlackwaterHeaden.   DIAGNOSIS, PRINCIPAL AND PRIMARY:  AXIS I: Schizoaffective disorder, bipolar type.   SECONDARY DIAGNOSES: AXIS I: No further.  AXIS II: No diagnosis.  AXIS III: End-stage renal failure, liver insufficiency, hypothyroidism, chronic pain.  AXIS IV: Severe from isolation.  AXIS V: Functioning at time of evaluation, 42.   ____________________________ Audery AmelJohn T. Portia Wisdom, MD jtc:dmm D: 04/18/2013 18:31:47 ET T: 04/18/2013 20:00:55 ET JOB#: [id number]  cc: Audery AmelJohn T. Gisele Pack, MD, <Dictator> Audery AmelJOHN T Maryse Brierley MD ELECTRONICALLY SIGNED 04/18/2013 21:51

## 2014-09-25 NOTE — Op Note (Signed)
PATIENT NAME:  Ronald [name], Ronald A MR#:  [id number] DATE OF BIRTH:  [birth year]  DATE OF PROCEDURE:  09/27/2012  PREOPERATIVE DIAGNOSES:  1. Complication of arteriovenous dialysis device.  2. End-stage renal disease, requiring hemodialysis.   POSTOPERATIVE DIAGNOSES:  1. Complication of arteriovenous dialysis device.  2. End-stage renal disease, requiring hemodialysis.   PROCEDURES PERFORMED:  1. Creation of a left arm basilic transposition arteriovenous fistula.  2. Ligation of left forearm loop graft.  3. Ultrasound-guided insertion of a tunneled dialysis catheter, right internal jugular.   SURGEON: Renford DillsGregory G. Schnier, MD  FIRST ASSISTANT: Hoyle Sauerhelsea N. Hanne, PA-C  ANESTHESIA: General by endotracheal intubation.   FLUIDS: Per anesthesia record.   ESTIMATED BLOOD LOSS: Minimal.   SPECIMEN: None.   INDICATIONS: The patient is a 81 year old gentleman who has had multiple complications regarding his left forearm loop graft. It has been established that this is no longer a suitable access secondary to the multiple pseudoaneurysms as well as the frequent thromboses in the venous stenosis portion of the outflow tract. Risks and benefits for creation of an AV fistula, ligation of the existing AV graft and then placement of a catheter while the fistula heals were reviewed. All questions answered. The patient agrees to proceed.   DESCRIPTION OF PROCEDURE: The patient is taken to the operating room and placed in the supine position. After adequate general anesthesia is induced and appropriate invasive monitors are placed, he is positioned supine with his neck slightly rotated to the left. Neck and chest wall are prepped and draped in a sterile fashion. Ultrasound is placed in a sterile sleeve. Jugular vein is imaged. It is echolucent and compressible, indicating patency. Image is recorded for the permanent record, and under direct ultrasound visualization, Seldinger needle is inserted into the jugular  vein. J-wire is advanced, and under fluoroscopy, verified to be positioned in the inferior vena cava. A small counterincision is made, dilator is passed over the wire and dilator and peel-away sheath are inserted. Dilator and wire are removed, and a 19 cm tip-to-cuff Cannon catheter is inserted through the peel-away sheath. The peel-away sheath is then removed. The catheter is then approximated to the chest wall. Exit site is selected. A small incision is created. Tunneling device is passed subcutaneously, dilated, and then the catheter is pulled subcutaneously. Under fluoroscopy, the tips are positioned at the atriocaval junction. The hub is then connected, and both lumens are aspirated and flushed easily. Cathflo 2 mg was reconstituted in 5 mL total volume, and 2.5 mL is instilled in each lumen. Sterile caps and dressing are applied. The neck counterincision is closed with 4-0 Monocryl and Dermabond.   Attention is then turned to the left arm, which is positioned extended palm upward and prepped and draped in a sterile fashion. A linear incision is then created overlying the basilic vein, and the basilic vein is meticulously skeletonized. Superficial nerves are carefully preserved. Once the entire vein has been dissected circumferentially up to the axillary vein, it is transected distally. It is approximated to the arm, and then site for the brachial artery is selected, and this is then exposed and dissected circumferentially. A small counterincision is then made at the apex of the arc of the vein, and it is pulled subcutaneously with a hemostat.   The brachial artery is then clamped with vascular clamps. Arteriotomy is made, 6-0 Prolene stay sutures are placed. The vein is beveled, and an end-vein-to-side brachial artery anastomosis is fashioned with running 6-0 Prolene. Flushing maneuvers are  then performed, and flow is re-established. Excellent thrill is noted; however, there is significant redundancy, and  ultimately, the vein is clamped proximally and distally. An approximately 1 cm segment is resected, and an end-to-end vein anastomosis is fashioned using running 6-0 Prolene. With this modification, there is now an excellent contour to the fistula. A small incision is then created overlying the arterial portion of the AV forearm loop graft. Right-angle is passed below the graft, and a 2-0 Ethibond suture is used to ligate the graft.   All three incisions are then irrigated, inspected for hemostasis and then closed in layers using 3-0 Vicryl in a running fashion, followed by 4-0 Monocryl subcuticular for closure, followed by Dermabond. The patient tolerated the procedure well. There were no immediate complications. Sponge and needle counts are correct, and he is taken to the recovery room in stable condition.    ____________________________ Renford Dills, MD ggs:OSi D: 09/27/2012 18:28:48 ET T: 09/28/2012 07:54:52 ET JOB#: [id number]  cc: Renford Dills, MD, <Dictator> Verdis Prime, MD Renford Dills MD ELECTRONICALLY SIGNED 10/14/2012 13:48

## 2014-09-25 NOTE — Op Note (Signed)
PATIENT NAME:  Ronald [name], Ronald [name] MR#:  [id number] DATE OF BIRTH:  1930-10-29  DATE OF PROCEDURE:  04/04/2013  PREOPERATIVE DIAGNOSES: 1.  Complication of left arm basilic transposition with poor flows and a stricture proximally.  2.  Complication of left arm basilic transposition with pseudoaneurysm formation.  3.  End-stage renal disease requiring hemodialysis.  4.  HEPARIN ALLERGY WITH DOCUMENTED HEPARIN ANTIBODY.   POSTOPERATIVE DIAGNOSES: 1.  Complication of left arm basilic transposition with poor flows and a stricture proximally.  2.  Complication of left arm basilic transposition with pseudoaneurysm formation.  3.  End-stage renal disease requiring hemodialysis.  4.  HEPARIN ALLERGY WITH DOCUMENTED HEPARIN ANTIBODY.   PROCEDURES PERFORMED: 1.  Contrast injection, left arm fistula.  2.  Placement of retrograde sheath, 8-French.  3.  Placement of a 6-French sheath, antegrade.  4.  Percutaneous transluminal angioplasty of the midportion of the fistula and the more proximal portion of the fistula.  5.  Placement of Viabahn stents for treatment of pseudoaneurysm, as well as failed angioplasty.   SURGEON: Levora Dredge, M.D.   SEDATION: Versed 4 mg plus fentanyl 150 mcg administered IV. Continuous ECG, pulse oximetry and cardiopulmonary monitoring is performed throughout the entire procedure by the interventional radiology nurse. Total sedation time was 1 hour, 35 minutes.   ACCESS:  1.  An 8-French sheath, retrograde direction, left basilic vein near the basilic axillary confluence.  2.  A 6-French sheath, antegrade direction, left basilic fistula.   CONTRAST USED: Isovue 55 mL.   FLUOROSCOPY TIME: 9.8 minutes.   INDICATIONS: Mr. Ronald [name] is an 81 year old gentleman who presented with skin changes and some ulceration overlying a pseudoaneurysm associated with his fistula. He is also noted to have significant strictures more proximally of the basilic vein, essentially extending up to  the basilic axillary confluence. The risks and benefits for angiography with the hope for salvage were reviewed. The patient has agreed to proceed.   DESCRIPTION OF PROCEDURE: The patient is taken to special procedures, and placed in the supine position. After adequate sedation is achieved, his left arm is extended palm upward and prepped and draped in a sterile fashion. Ultrasound is placed in a sterile sleeve. Using ultrasound to attempt to define the fistula at the level of the anastomosis, the vessel was identified. It was echolucent, mildly pulsatile, suggesting it was the fistula. Access was then obtained after images were recorded with direct visualization using a micropuncture needle, microwire followed by microsheath, J-wire followed by a 6-French sheath. Initial hand injection demonstrates the sheath is actually within the brachial artery, and a 6-French Mynx device is deployed with excellent result. However, having obtained the initial images of the fistula itself, it is quite apparent that between the pseudoaneurysm, the fairly sclerotic vein at the level of the anastomosis and the greater than 90% tandem stenoses extending through the basilic vein, that approach for treatment is going to be from the axillary vein in a retrograde direction, and therefore, once the Mynx device was deployed, attention was turned to the axilla. Again, the ultrasound was utilized. The axillary vein was identified, as was what appeared to be reasonable caliber basilic at the confluence, and this site was selected for access. Microwire is then negotiated in a retrograde direction into the fistula. Subsequently, a 6-French sheath and then an 8-French sheath is inserted. The 8-French sheath is being utilized to repair the pseudoaneurysm. A Viabahn stent will be needed; however, since he has HEPARIN ALLERGY, only the old non-Propaten  Viabahn stents can be utilized. These have been special ordered, but only come in the 0.035  system requiring an 8-French sheath.   A KMP catheter is then negotiated with the Magic Torque wire down into the radial artery. Retrograde angiography is obtained under magnified views, and subsequently a 7 x 4 balloon is used to angioplasty the 2 stenotic areas, which are located in between the pseudoaneurysm in the venous puncture sites and then just above the venous puncture site, or more proximal, heading into the more proximal basilic vein. Results after angioplasty are inadequate, and it is therefore elected to place a Viabahn stent. The initial Viabahn stent is a 7 x 5, and this is deployed beginning at the level of the anastomosis and crosses the pseudoaneurysm, effectively excluding this. An 8 x 5 is then selected to cross the area of failed angioplasty, and these are both post dilated with a 7 balloon and fully expanded. Followup angiography demonstrates an excellent result; however, there remains a 4 cm segment of sclerotic basilic vein that extends up to the confluence of the axillary, and this is not able to be treated from the existing sheath. Attempts at removing the sheath with a Mynx device to minimize bleeding was unsuccessful, and pressure is held.   Ultrasound is then again utilized, and an area in the midportion of the fistula where the Viabahn stent has been deployed, is selected for access. A micropuncture needle is used, microwire followed by microsheath, J-wire followed by a 6-French sheath. Magic Torque wire is then advanced through the fistula into the central venous system, and an 8 x 6 [place] balloon is advanced across the lesion. It is then inflated ultimately to 28 atmospheres for 2 full minutes. Followup angiography demonstrates that there is a fairly good result with luminal gain equalling 7 to 8 mm, nicely approximating this selected balloon. Unfortunately, on closer inspection, the act of putting the 6-French sheath into the newly placed Viabahn stent has caused the stent to  buckle somewhat. With the balloon inflated, reflux imaging demonstrates that the fistula is patent. There is now a bit of a gap between the 2 stents and some irregularity of the more proximal stent, the 8 mm one, but it appears to be patent with rapid flow of contrast. At this point, replacing or putting an 8-French sheath through the Viabahn stent to bridge this gap seems illogical. There is good flow in the fistula, and I have elected to terminate the procedure. I will get an ultrasound to see, and if indeed there is flow abnormality between the 2 stents, arrangements will be made for placement of a bridging Viabahn stent.   The patient tolerated the procedure well, and there were no immediate complications other than the slight abnormality of the 8 mm Viabahn stent at its leading edge.   INTERPRETATION: Initial views demonstrate that the fistula has multiple problems. There are long segments of sclerotic greater than 90% stenosis, there is the pseudoaneurysm. Ultimately, these are all addressed through a variety of sheath placements, using Viabahn stents to exclude the pseudoaneurysm, and then treat the area of the fistula that was narrowed, which lies within the actual puncture site or zone. More proximally, at the confluence of the basilic and the axillary, an 8 mm balloon inflation was used to stand alone.      ____________________________ Renford Dills, MD ggs:dmm D: 04/04/2013 12:21:01 ET T: 04/04/2013 12:44:44 ET JOB#: [id number]  cc: Renford Dills, MD, <Dictator> [facility]  Drue Flirtarol Henry-Smith, MD Renford DillsGREGORY G Gunda Maqueda MD ELECTRONICALLY SIGNED 04/11/2013 8:15

## 2014-09-25 NOTE — Op Note (Signed)
PATIENT NAME:  Ronald HeaterOTEAT, Uriyah A MR#:  [id number] DATE OF BIRTH:  [birth year]  DATE OF PROCEDURE:  03/24/2013  PREOPERATIVE DIAGNOSES:  1.  End-stage renal disease.  2.  Pseudoaneurysm of arteriovenous fistula precluding use.  3.  Strokes.  4.  Hypertension.   POSTOPERATIVE DIAGNOSES: 1.  End-stage renal disease.  2.  Pseudoaneurysm of arteriovenous fistula precluding use.  3.  Strokes.  4.  Hypertension.   PROCEDURES: 1.  Ultrasound guidance for vascular access to right internal jugular vein.  2.  Fluoroscopic guidance for placement of catheter.  3. Placement of a 23 cm tip-to-cuff tunneled hemodialysis catheter via the right internal jugular vein.   SURGEON:  Festus BarrenJason Wayde Gopaul, MD   ANESTHESIA: Local with moderate conscious sedation.   BLOOD LOSS: Approximately 25 mL.   INDICATION FOR PROCEDURE: An 81 year old, African-American male with end-stage renal disease. His AV fistula has a pseudoaneurysm precluding use. At this point, we are placing a Perm-Cath and we will attempt to treat the pseudoaneurysm subsequently.  DESCRIPTION OF THE PROCEDURE: The patient was brought to the vascular and interventional radiology suite. The patient's right neck and chest were sterilely prepped and draped and a sterile surgical field was created. The right internal jugular vein was visualized with ultrasound and found to be patent. It was then accessed under direct ultrasound guidance and a permanent image was recorded. A wire was placed. After a skin nick and dilatation, the peel-away sheath was placed over the wire.   I then turned my attention to an area under the clavicle. Approximately 2 fingerbreadths below the clavicle a small counter incision was created and we tunneled from the subclavicular incision to the access site. Using fluoroscopic guidance, a 23 cm tip-to-cuff tunneled hemodialysis catheter was selected, tunneled from the subclavicular incision to the access site. It was then placed through the  peel-away sheath and the peel-away sheath was removed. The catheter tips were parked in the right atrium. The appropriate distal connectors were placed. It withdrew blood well and flushed easily with heparinized saline and a concentrated heparin solution was then placed. It was secured to the chest wall with 2 Prolene sutures. The access incision was closed with a single 4-0 Monocryl. A 4-0 Monocryl pursestring suture was placed around the exit site. Sterile dressings were placed.   The patient tolerated the procedure well and was taken to the recovery room in stable condition.    ____________________________ Annice NeedyJason S. Evalena Fujii, MD jsd:aw D: 03/24/2013 14:35:10 ET T: 03/24/2013 15:06:33 ET JOB#: [id number]  cc: Annice NeedyJason S. Ai Sonnenfeld, MD, <Dictator> Annice NeedyJASON S Errin Chewning MD ELECTRONICALLY SIGNED 03/26/2013 11:55

## 2014-09-25 NOTE — Discharge Summary (Signed)
PATIENT NAME:  Ronald [name], Ronald [name] MR#:  [id number] DATE OF BIRTH:  Jun 03, 1931  DATE OF ADMISSION:  04/15/2013 DATE OF DISCHARGE:  04/21/2013  ADMITTING DIAGNOSES: Endstage renal disease, acute psychosis with mania.  DISCHARGE DIAGNOSIS:  1.  Schizoaffective disorder, bipolar type.  2.  Endstage renal disease.  3.  History of liver insufficiency. 4.  Hypothyroidism. 5.  Chronic pain syndrome.  6.  History of hemodialysis on Tuesday, Thursday, and Saturday schedule. 7.  Anemia of chronic disease. 8.  Thrombocytopenia, suspected due to heparin products, HIT pending.  9.  History of chronic constipation. 10.  Hypotension, midodrine dependent.  DISCHARGE CONDITION: Stable.   DISCHARGE MEDICATIONS: The patient is to continue: 1.  Duragesic 50 mcg transdermal patch every 72 hours.  2.  Midodrine 10 mg 3 times weekly on Tuesday, Thursday, and Saturday 30 minutes prior to hemodialysis.  3.  Renvela 800 mg capsules 3 tablets 3 times daily with meals and 1 tablet at bedtime with snacks.  4.  Levothyroxine 25 mcg p.o. daily.  5.  Claritin 10 mg p.o. daily.  6.  Lidocaine prilocaine topical cream 2.5/2.5% topically to left shunt area on Tuesdays and Thursdays as well as Saturdays 30 minutes prior to procedure.  7.  Nephro-Vite oral tablets which is multivitamins 1 tablet once daily.  8.  Lac-Hydrin 12% topical cream apply topically to affected area once a day at bedtime to dry feet.  9.  Iron gluconate 325 mg once daily.  10.  Depakote 500 mg p.o. at bedtime.  11.  Systane 1 drop to each affected eye twice daily.  12.  Aspirin 81 mg p.o. daily.  13.  Norco 325/5 mg 1 tablet every 4 hours as needed.  14.  Lidoderm 5% topical patch every day to affected area.  15.  Benztropine 1 mg twice daily.  16.  Lactulose 30 mL twice daily on Sunday and once daily on Monday through Saturday.  17.  Haldol 1 mg p.o. every 4 hours as needed.  18.  Haldol 5 mg twice daily.  19.  Lorazepam 1 mg twice daily.  20.   Cogentin 0.5 mg every 12 hours as needed.  21.  Lipitor 20 mg p.o. at bedtime.  22.  Olanzapine 20 mg p.o. at bedtime.  23.  Olanzapine 5 mg twice daily at 9 as well as 5:00 p.m.  24.  Alprazolam 1 mg disintegrating tablet 1 tablet every 6 hours as needed.  25.  Senna 1 tablet once daily as needed.  26.  Nicotine oral inhaler 10 mg inhalation every 1 to 2 hours as needed.   HOME OXYGEN: None.   DIET: 1500 mL fluid restriction, hemodialysis diet, mechanical soft consistency.   ACTIVITY LIMITATIONS: As tolerated.   REFERRALS: To physical therapy.  FOLLOWUP APPOINTMENT: With Dr. Johny Blamer in 2 days after discharge as well as hemodialysis Tuesday and Thursday as well as Saturday schedule. Also, the patient is to follow up with Dr. Omelia Blackwater, as per Dr. Michaelle Copas recommendations, in the next few days after discharge.   CONSULTANTS: Dr. Toni Amend, care management, Dr. Thedore Mins, Dr. Harvie Junior, Dr. Wynelle Link, Dr. Cherylann Ratel.   RADIOLOGIC STUDIES: Chest x-ray on 11th of November 2014 showed dialysis catheter noted with tip projecting in the right atrium. No acute cardiopulmonary disease. T  HISTORY OF PRESENT ILLNESS: The patient is an 81 year old African American male with history of schizoaffective disorder who presents to the hospital refusing dialysis. Apparently the patient was increasingly agitated, paranoid and voicing delusions as well  as hyperreligious threats. He refused to cooperate with diuresis, and he was brought to the Emergency Room for further evaluation. In the Emergency Room, since he refused hemodialysis for the past 1 week, the admission was made to internal medicine service and consultation with Dr. Toni Amendlapacs, psychiatrist, was obtained.   HOSPITAL COURSE: First, in regards to schizoaffective disorder, the patient was managed on advanced doses of Zyprexa with some improvement of his overall condition. It was felt that the patient's condition had relatively improved and Dr. Lilia ProHeaden's followup was  recommended by Dr. Toni Amendlapacs with whom I discussed the patient's case today, on the day of discharge, 17th of November 2014.   In regards to end-stage renal disease, the patient received hemodialysis on 2 consecutive days, on the 14th as well as the 15th of November. Initially on the 14th of November 2014, the patient's hemodialysis was short since his blood pressure was very low. However, after IV fluid administration the patient's blood pressure improved and the patient received full dialysis on the 15th of November 2014. Next hemodialysis will be the 18th of November 2014 per his usual schedule. The patient is to continue midodrine prior to hemodialysis. His oxygenation remained stable and he is on room air. His O2 sats are 93% to 95% on room air at rest. The patient was followed by the nephrologist while he was in the hospital and recommended to continue therapy. The patient was evaluated by palliative care physician, Dr. Harvie JuniorPhifer, who discussed the code status with the patient's sister. The patient was recommended to be DNR by the patient's sister, who is power of attorney for medical care. We completed DNR sheet and placed it in the chart at his discharge. In regards to hypothyroidism, the patient is to continue his Synthroid. For anemia of chronic disease, it was stable. The patient's hemoglobin level was 9.6 on the 14th of November 2014. In regards to thrombocytopenia, when the patient arrived to the hospital, he received some heparin products. Platelet count was noted to be low on admission, 146, and even decreased to lower level of 79 on the 16th of November 2014. It was felt to be likely due to heparin products and those were discontinued. HIT testing was sent; however, those results are still not yet obtained. It is recommended to avoid heparin products as outpatient including hemodialysis center. It is recommended to also follow the patient's count as outpatient to make decisions about his therapies if  needed. In regards to chronic constipation, the patient is to continue his outpatient management. For his hypotension, he is to continue midodrine, as mentioned above. The patient is being discharged in stable condition with the above-mentioned medications and follow-up. His vital signs on the day of discharge: Temperature was 97.1, pulse was 83, respiration rate was 18, blood pressure 130/74, and saturation was 93 to 95% on room air at rest.   TIME SPENT: 40 minutes.  ____________________________ Katharina Caperima Zoella Roberti, MD rv:sb D: 04/21/2013 14:32:48 ET T: 04/21/2013 15:13:51 ET JOB#: [id number]  cc: Katharina Caperima Ryelee Albee, MD, <Dictator> Marguerita BeardsKenneth J. Headen, MD [facility] Dr. Johny Blameraroline Smith Craig Wisnewski MD ELECTRONICALLY SIGNED 05/05/2013 19:19

## 2014-09-25 NOTE — Consult Note (Signed)
PATIENT NAME:  Ronald [name], Ronald [name] MR#:  [id number] DATE OF BIRTH:  12/08/1930  DATE OF CONSULTATION:  04/16/2013  REFERRING PHYSICIAN:   CONSULTING PHYSICIAN:  Audery AmelJohn T. Merritt Kibby, MD  IDENTIFYING INFORMATION AND REASON FOR CONSULTATION: An 81 year old man referred from an assisted living facility because of refusing dialysis. Consult for appropriate psychiatric treatment.   HISTORY OF PRESENT ILLNESS: Information obtained from the patient but also from his referring psychiatrist and assisted living facility. The patient has [name] history of schizoaffective disorder. According to the people at his assisted living facility, for the last week or so he has been increasingly agitated, paranoid and voicing delusions and hyperreligious threats. He has been refusing to cooperate with dialysis. On Monday, they took him to see Dr. Omelia BlackwaterHeaden, his usual outpatient psychiatrist. Dr. Omelia BlackwaterHeaden discontinued the patient's Gean Birchwoodnvega Sustenna shot and put him on Haldol 5 mg twice [name] day. He continued him on his current dosage of Zyprexa which appears to have been 5 mg [name] day and his Depakote. Dr. Omelia BlackwaterHeaden made [name] note that the patient might need to be [name] but did not make arrangements for that to happen. The patient apparently continued to be agitated and refused dialysis so was brought to our Emergency Room. On initial evaluation by nursing, the patient was hyperreligious. He was shouting and frequently stating that he was [facility], that he could never die and that he was going to send everyone to hell or buy them [name] [place]. He would not give any more relevant history. He seemed to be rather belligerent. On my interview, I found it difficult to understand anything that he was saying. I did hear him tell me that it was impossible for him to die and that he had been killed 8 times already without effect. He told me that I was going to die but that he could not die. He seemed to be making some hyperreligious  verbalizations, but I could not make out the sense of it. When I tried to ask him direct questions, he just became more agitated and would not answer direct questions. It is not clear to me whether there had been any other recent changes to his medications or any new stresses that could have caused any worsening of his illness. This seems to have been going on for [name] few weeks. Past history suggests that he does have [name] baseline that is [name] little bit higher functioning than this.   PAST PSYCHIATRIC HISTORY: Long history of schizophrenia going back to when he was much younger. Despite this, he has had relatively little contact with psychiatry at our facility. Several years ago, I see that Dr. Lenis NoonLevine was consulted and helped do some management with the patient, at which time it seemed that [name] combination of Depakote and Zyprexa was most helpful for him. It is not clear to me whether he has any past history of suicide or how much agitation or violence. He has had in the past.   SOCIAL HISTORY: [facility]. I am not clear as to whether he has any family involved. Nobody has been present here in the Emergency Room as far as family.   MEDICAL HISTORY: The patient has end-stage renal failure and is dialysis dependent. He also has chronic pain, hypothyroidism, allergies, hypertension, dyslipidemia, chronic anemia, chronic constipation.   ALLERGIES: HEPARIN is listed.   CURRENT MEDICATIONS: I see [name] very long list including [name] Duragesic patch  50 mcg, Gean Birchwood which as we know was discontinued [name] couple of days ago but we do not know how long ago it was last given, Xalatan ophthalmic in his eyes, Zyprexa 5 mg at night, levothyroxine 25 mcg per day, Claritin 10 mg [name] day, Lipitor 20 mg [name] day, Senna 2 tablets twice [name] day, ferrous gluconate 324 mg once [name] day, Depakote 500 mg at night, benztropine 0.5 mg once [name] day at night, haloperidol 1 mg every 4 hours,  although I see [name] new order from Dr. Omelia Blackwater that says 5 mg twice [name] day, aspirin 81 mg [name] day, Ativan p.r.n., Norco 5 mg every 4 hours as needed, Percocet 5 mg every 6 hours as needed (it is not clear if he is being given both of these, only one or neither), lactulose 10 g once [name] day, Lidoderm 5% to his arm. That seems to be related to dialysis.   SUBSTANCE ABUSE HISTORY: No relevant recent substance use problems.   FAMILY HISTORY: Unknown.   REVIEW OF SYSTEMS: The patient was too disorganized and incoherent to give me any specific information. He did not seem to be complaining of any particular pain.   MENTAL STATUS EXAMINATION: Disheveled man, looks his stated age, interviewed in the Emergency Room. Eye contact was intermittent at best. Speech was loud and slurred, hard to understand. Affect constricted and appeared angry, although he did not make any threatening gestures towards me. He did get quite upset when I turn the television off and calmed down [name] little bit when I turned it back on during our conversation. Thoughts appear to be disorganized and very loose. Appeared to be probably delusional and hyperreligious. Judgment and insight poor. Intelligence impaired by his psychosis, whatever it is at baseline.   LABORATORY RESULTS: His creatinine is 8.97, BUN 52. Obviously, he has end-stage renal failure. Drug screen is negative, including not showing any sign of opiates. TSH 2.06 which is in the normal range. Chemistry panel, as I mentioned, shows end-stage renal failure, otherwise fairly unremarkable. Kidney function appears to be normal. Normal total protein. Only slightly low albumin at 3.3. He is anemic with [name] hematocrit of 29 and [name] hemoglobin of 9.5. Platelet count is 146. White count normal.   VITAL SIGNS: Emergency Room vital signs show most recent blood pressure of 143/67, pulse 72 and temperature 97.8.   ASSESSMENT: An 81 year old man with severe medical problems. He does not appear to be  able to get up and move around on his own independently. He is clearly incapable of making any decisions about his care. He is agitated and does not appear to be cooperating with care for himself. The concern about bringing him to the hospital was his refusal of dialysis. I was not even able to bring the subject up with him because of how agitated he was. Because of his age and the degree of his agitation and his medical needs, he would not be appropriate for admission to the psychiatric ward at our hospital. I think the patient needs to have some aggressive care for his mental health symptoms right now and then try to get him into dialysis.   TREATMENT PLAN: I have made some adjustments to his medicines. I have increased his dose of olanzapine to 20 mg at night while leaving the 5 mg twice [name] day p.r.n. His Depakote for now will be left at 500 mg at night. I also asked that he go ahead and get some Zyprexa when I  saw him. Hopefully, if he can show some improvement in his psychosis, then he will cooperate with dialysis, at which point he can be either returned to his assisted living facility and if not, he can be referred to [name] geriatric psychiatry unit.   DIAGNOSIS, PRINCIPAL AND PRIMARY:  AXIS I: Schizoaffective disorder, bipolar type, manic.   SECONDARY DIAGNOSES:  AXIS I: No further.  AXIS II: No diagnosis.  AXIS III: End-stage renal insufficiency, chronic pain, hypothyroid, constipation, anemia.  AXIS IV: Severe from the degree of his illness and lack of obvious social support.  AXIS V: Functioning at time of evaluation 25,   TOTAL TIME SPENT ON THE CONSULT: 50 minutes.   ____________________________ Audery Amel, MD jtc:gb D: 04/16/2013 21:57:35 ET T: 04/16/2013 23:14:13 ET JOB#: [id number]  cc: Audery Amel, MD, <Dictator> Audery Amel MD ELECTRONICALLY SIGNED 04/17/2013 11:13

## 2014-09-25 NOTE — Op Note (Signed)
PATIENT NAME:  Ronald [name], Ronald [name] MR#:  [id number] DATE OF BIRTH:  [birth year]  DATE OF PROCEDURE:  09/06/2012  PREOPERATIVE DIAGNOSES: 1.  Complication AV dialysis device with prolonged bleeding and poor dialysis runs, left forearm loop graft.  2.  End-stage renal disease requiring hemodialysis. 3.  Atherosclerotic occlusive disease bilateral lower extremities.   POSTOPERATIVE DIAGNOSES:   1.  Complication AV dialysis device with prolonged bleeding and poor dialysis runs, left forearm loop graft.  2.  End-stage renal disease requiring hemodialysis. 3.  Atherosclerotic occlusive disease bilateral lower extremities.  PROCEDURE PERFORMED:   1.  Contrast injection left arm, forearm loop graft.  2.  Percutaneous transluminal angioplasty, left brachial vein to 8 mm.   SURGEON:  Levora Dredge, M.D.  SEDATION:  Versed 4 mg plus fentanyl 150 mcg administered IV.  Continuous ECG, pulse oximetry, and cardiopulmonary monitoring was performed throughout the entire procedure by the interventional radiology nurse.  Total sedation time was 40 minutes.   ACCESS:  A 6-French sheath left forearm AV loop graft in an antegrade direction.   CONTRAST USED:  Isovue 36 mL.   FLUOROSCOPY TIME:  1.8 minutes.   INDICATIONS:  The patient presented as a referral from the dialysis center with poor dialysis runs and prolonged bleeding following decannulation.  There were some benefits for evaluation of his AV graft and possible treatment were reviewed.  All questions have been answered and the patient has agreed to proceed.   PROCEDURE:  The patient is taken to the special procedures suite, placed in the supine position.  After adequate sedation is achieved he is positioned supine and his left arm is extended palm upward.  Left arm was prepped and draped in a sterile fashion.  1% lidocaine is infiltrated in the soft tissues and access to the graft is obtained in an antegrade direction using a micropuncture needle,  microwire, followed by micro sheath, a J-wire followed by a 6-French sheath.  Hand injection of contrast then used to demonstrate the graft itself.  Venous outflow and central veins are then imaged serially.  After review of the images a half bolus of Angiomax was given as the patient is ALLERGIC TO HEPARIN and wires negotiated into the venous outflow tract.  First, a 7 x 6, but ultimately an 8 x 6 balloon is advanced across several long strictures within the brachial vein.  Following angioplasty to 14 atmospheres with an 8 mm wide balloon there is now patency of the venous outflow.  After further imaging the wire and balloon were removed, sheath is pulled after pursestring sutures placed and there are no immediate complications.   INTERPRETATION:  Initial images of the left forearm loop graft demonstrate several pseudoaneurysms are noted, but the graft itself appears to be in reasonable condition and there are no flow-limiting strictures or stenoses throughout the entire course of the graft.  Approximately 2 to 3 cm just proximal to the venous anastomosis there is a long 4 to 5 cm long in length tapered segment of the venous outflow.  This represents at least an 85% to 90% stenosis.  A similar lesion is noted, although not quite as long, several centimeters more proximal to the first described lesion.  Following angioplasty of 7 mm there is inadequate result and therefore an 8 mm balloon is utilized and this yields a fairly good result.    Of note, the patient's basilic vein appears to be rather well-matured and adequate in length extending from just below the level of  the antecubital fossa up to the axillary vein.  Central veins are all widely patent and this does appear to yield a vein that would be suitable for basilic transposition.  Furthermore, it should be noted that the patient's venous outflow is quite tenuous and will not last and that given the large pseudoaneurysms, although stable at this time, I  would advocate creating a fistula or a brachial axillary graft in the very near future as this will not be a durable reconstruction for this forearm loop graft and ultimately this graft will not be adequate.     ____________________________ Renford DillsGregory G. Tunisha Ruland, MD ggs:ea D: 09/07/2012 14:31:08 ET T: 09/08/2012 04:16:14 ET JOB#: [id number]  cc: Renford DillsGregory G. Starling Jessie, MD, <Dictator> Renford DillsGregory G. Dimond Crotty, MD [facility] Renford DillsGREGORY G Heidy Mccubbin MD ELECTRONICALLY SIGNED 10/14/2012 13:40

## 2014-09-25 NOTE — Consult Note (Signed)
Brief Consult Note: Diagnosis: schizoaffective disorder.   Patient was seen by consultant.   Consult note dictated.   Comments: Psychiatry: PAtient seen. Note dictated. PAtient is a elderly gentleman with chronic psychosis. Currently states he is [employer]. Nevertheless he cooperated with dialysis today. Tolerating current meds. I advise continuing current meds and that [facility] be [facility] if medically stable enough.  Electronic Signatures: Brendaly Townsel, Jackquline DenmarkJohn T (MD)  (Signed [phone]-Nov-14 18:23)  Authored: Brief Consult Note   Last Updated: 14-Nov-14 18:23 by Audery Amellapacs, Eugune T (MD)

## 2014-09-25 NOTE — Op Note (Signed)
PATIENT NAME:  Ronald [name], Ronald [name] MR#:  [id number] DATE OF BIRTH:  [birth year]  DATE OF PROCEDURE:  02/19/2013  PREOPERATIVE DIAGNOSIS: End-stage renal disease with functional permanent dialysis access and no longer needing PermCath.   POSTOPERATIVE DIAGNOSIS: End-stage renal disease with functional permanent dialysis access and no longer needing PermCath.   PROCEDURE PERFORMED: Removal of right jugular PermCath.   SURGEON: [place] Heaney, [address].   ANESTHESIA: Local.   ESTIMATED BLOOD LOSS: Minimal.   INDICATION FOR THE PROCEDURE: The patient is an 81 year old, African American male with end-stage renal disease. His access is functional and he no longer needs his PermCath, therefore this will be removed.   DESCRIPTION OF THE PROCEDURE: The patient is brought to the vascular interventional radiology area and positioned supine. The right neck and chest and existing catheter were sterilely prepped and draped and [name] sterile surgical field was created. The area was locally anesthetized copiously with 1% lidocaine. Transverse incision was made over the cuff with an 11 blade. Hemostats were used to help dissect out the cuff. The catheter was then removed in its entirety without difficulty with gentle traction. Pressure was held at the base of the neck. Monocryl 4-0 suture was placed in the incision and Dermabond dressing over that. [name] dry sterile dressing was placed over catheter entrance site. The patient tolerated the procedure well. No complications.    ____________________________ Hoyle Sauerhelsea N. Carmin Dibartolo, PA-C cnh:aw D: 02/19/2013 09:38:50 ET T: 02/19/2013 09:42:28 ET JOB#: [id number]  cc: Hoyle Sauerhelsea N. Shantella Blubaugh, PA-C, <Dictator> Flecia Shutter N Janeann Paisley PA ELECTRONICALLY SIGNED 03/17/2013 15:35

## 2014-09-26 NOTE — H&P (Signed)
PATIENT NAME:  Ronald [name], Ronald [name] MR#:  [id number] DATE OF BIRTH:  08/06/1930  DATE OF ADMISSION:  11/26/2013  REFERRING PHYSICIAN:  Dr. Manson Passey.   PRIMARY CARE PHYSICIAN:  Ananias Pilgrim  CHIEF COMPLAINT: Fever.  HISTORY OF PRESENT ILLNESS:  An 81 year old African American gentleman with past medical history of hypertension, end-stage renal disease, hemodialysis Tuesday, Thursday, Saturday, hypothyroidism, presenting with fever, presented from [facility] with known sacral skin lesion.  Sent in for fever with hopes of achieving a central line and being discharged to receive antibiotics. On arrival, noted to be febrile, temperature of 100.9 degrees Fahrenheit rectal, as well as hypotensive. Systolic blood pressures in the 90s.  ER staff initiated code sepsis.  Received IV fluid hydration.  Pan cultures, antibiotics, and central line has been placed. The patient is unable to provide any meaningful information given mental status and medical condition.  REVIEW OF SYSTEMS:  Unable to fully obtain given patient's mental status and medical condition.   PAST MEDICAL HISTORY: Hypertension, hypothyroidism, end-stage renal disease on hemodialysis Tuesday, Thursday, Saturday. Sacral wound present on admission.   SOCIAL HISTORY: No alcohol, tobacco or drug usage. Resides at [facility].   FAMILY HISTORY: No documentation of cardiopulmonary disorders.   ALLERGIES: HEPARIN.   HOME MEDICATIONS: Include Duragesic patch 50 mcg q. 72 hours, Norco 325/5 mg p.o. q.4 hours as needed for pain, Ativan 0.5 mg p.o. as needed for anxiety, Depakote extended release 500 mg p.o. daily, Lipitor 40 mg p.o. at bedtime, haloperidol 1 mg by mouth b.i.d. as needed for hallucinations, haloperidol 5 mg p.o. b.i.d., Zyprexa 20 mg p.o. at bedtime, Zyprexa 5 mg p.o. b.i.d., lidocaine topical 5% ointment to affected area 3 times daily as needed for pain, ferrous gluconate 324 mg p.o. daily, lactulose 10 grams 30 mL b.i.d. on Sundays,  Senna-S 50/8.6 mg 2 tabs p.o. b.i.d., and midodrine 10 mg p.o. 30 minutes prior to dialysis, Xalatan 0.005% ophthalmic solution 1 drop to each eye at bedtime, Renvela 800 mg 3 tablets p.o. 3 times daily with meals, levothyroxine 25 mcg p.o. daily, Nephro-Vite multivitamin 1 tablet p.o. daily.   PHYSICAL EXAMINATION:  VITAL SIGNS: After receiving Tylenol, temperature 100.2 degrees Fahrenheit, heart rate 92, respirations 16, blood pressure at lowest to 99/51 documented, saturating 96% on room air. Weight 82.6 kg, BMI 27.7.  GENERAL: Chronically ill-appearing African American gentleman in minimal distress as given by mental status, fever.  HEAD: Normocephalic, atraumatic.  EYES:  Pupils equal, round, react to light.intact. No scleral icterus.  MOUTH: Dry mucosal membrane. Dentition intact. No abscess noted.  EARS, NOSE, AND THROAT:  Clear without exudates. No external lesions.  NECK: Supple. No thyromegaly. No nodules. No JVD.  PULMONARY: Clear to auscultation bilaterally without wheezes, rubs or rhonchi. No use of accessory muscles. Good respiratory effort.  CHEST: Nontender to palpation.  CARDIOVASCULAR: S1, S2, regular rate and rhythm. No murmurs, rubs, or gallops. No edema. Pedal pulses 2+ bilaterally.  Triple-lumen IJ placed in left neck.  GASTROINTESTINAL: Soft, nontender, nondistended. No masses. Positive bowel sounds. No hepatosplenomegaly.  MUSCULOSKELETAL: No swelling, clubbing, or edema. Range of motion full in all extremities.  NEUROLOGIC: Cranial nerves II through XII intact. No gross focal  deficits. Sensation is intact.    SKIN: No ulceration, lesion, rash, cyanosis. Skin warm, dry. Turgor poor. Has a stage I sacral wound without drainage noted, present on admission.  MUSCULOSKELETAL: No swelling, clubbing, or edema. Range of motion full in all extremities.  NEUROLOGIC: Difficult to assess  given patient's mental status. However, appears to move all extremities without difficulty. Able  to follow simple commands intermittently.   PSYCHIATRIC:  Unable to fully assess given the patient's current mental status. At this time, he is lethargic. When he does awake, he is able to answer some simple questions. He is oriented to place as well as person, however not to time. Insight and judgment appear poor at this time.   LABORATORY DATA: Chest x-ray of the left IJ overlies the SVC. No cardiopulmonary processes.  Remainder of laboratory data: Sodium 138, potassium 4.1, chloride 99, bicarbonate of 34, BUN 19, creatinine 4.7, glucose 73. LFTs: Albumin of 2.3, otherwise within normal limits. WBC 6.2, hemoglobin 7.7, platelets 179,000. Urinalysis: WBC 10, RBC 73, leukocyte esterase 2+, nitrite negative. ABG performed, 7.46/53/65, bicarbonate 37. Lactic acid of 0.7.   ASSESSMENT AND PLAN: An 81 year old African American gentleman with hypertension, end-stage renal disease on dialysis, presenting with fever. 1. Severe sepsis.  Meeting sepsis criteria by temperature and heart rate on arrival. Severe sepsis is indicated by creatinine greater than 2 and systolic blood pressure greater than 40 points off of his normal blood pressure. Code sepsis initiated in the Emergency Department. Central line placed. Panculture including blood, urine. Broad-spectrum antibiotics with presumptive wound source vancomycin and Zosyn. Follow culture data.  We will monitor CVP for goal CVP between 8 and 11. IV fluid hydration to keep mean arterial pressure greater than 65. May require pressor therapy. If so, we will initiate Levophed at that time.  2. Sacral wound which is present on admission.  Consult wound therapy.  3. End-stage renal disease on dialysis. We will consult nephrology for continuation of dialysis.  4. Hypothyroidism. Continue with Synthroid.  5. Venous thromboembolism prophylaxis with sequential compression devices.   CODE STATUS: The patient is full code.   CRITICAL CARE TIME SPENT: 45 minutes.     ____________________________ Cletis Athensavid K. Hower, MD dkh:dd D: 11/26/2013 02:16:00 ET T: 11/26/2013 02:40:31 ET JOB#: [id number]  cc: Cletis Athensavid K. Hower, MD, <Dictator> DAVID Synetta ShadowK HOWER MD ELECTRONICALLY SIGNED 11/26/2013 3:45

## 2014-09-26 NOTE — H&P (Signed)
PATIENT NAME:  [name], Novah A MR#:  [id number] DATE OF BIRTH:  July 31, 1930  DATE OF ADMISSION:  02/21/2014  PRIMARY CARE PHYSICIAN: Verl BangsHolly Layman, MD  CHIEF COMPLAINT: Anemia.   HISTORY OF PRESENTING ILLNESS: An 81 year old, African-American male patient with history of end-stage renal disease on hemodialysis, schizoaffective disorder, anemia of chronic disease, who presents to the emergency room after he was found to have worsening hemoglobin. The patient's baseline hemoglobin seems to be around 7.5 to 8. He was 8.5 four days prior. Today, his hemoglobin was checked and it was 7 and he was sent to the emergency room. The patient is unable to contribute any history. He is nonverbal, as in 1 to 2 word which are incoherent. Occult stool was done, which seems to have been weakly positive, although it is unclear if this was contaminated from his sacral wound. The patient does not have any recent EGDs or colonoscopies that are found in records. No history of GI bleed.   REVIEW OF SYSTEMS: Unobtainable secondary to the patient's dementia, schizoaffective disorder.   PAST MEDICAL HISTORY: 1. Hypertension.  2. End-stage renal disease on hemodialysis Tuesday, Thursday, Saturday.  3. Sacral wound, stage 4, present on admission.  4. Anemia of chronic disease.  5. Hypothyroidism.  6. Dementia.  7. Schizoaffective disorder.  8. Malnutrition.   SOCIAL HISTORY: The patient does not smoke. No alcohol. No illicit drug use. [facility].   FAMILY HISTORY: Reviewed, but unknown.   ALLERGIES: HEPARIN WITH HISTORY OF HEPARIN-INDUCED THROMBOCYTOPENIA.   HOME MEDICATIONS: 1. Ativan 0.5 mg oral every 6 hours as needed.  2. Ativan 1 mg oral 2 times a day on Monday, Wednesday, Friday, Sunday.  3. Benztropine 0.5 mg oral every 12 hours as needed.  4. Depakote 5 mg oral once a day.  5. Duragesic patch every 72 hours.  6. Ferrous gluconate 324 mg oral once a day at bedtime.  7. Haloperidol 1 mg  every 12 hours as needed for hallucinations.  8. Haloperidol 5 mg oral 2 times a day.  9. Lactulose 30 mL oral 2 times a day on Sunday.  10. Levothyroxine 125 mcg once a day.  11. Lidocaine topical 2 times a day as needed for pain.  12. Lipitor 20 mg oral once a day.  13. Midodrine 10 mg oral once a day prior to dialysis.  14. Nephro-Vite 1 tablet oral once a day.  15. Norco 325/5, 1 tablet every 4 hours as needed for pain.  16. Olanzapine 2.5 mg oral 2 times a day.  17. Renvela 0.8 three times a day.  18. Senna 8.6 mg oral 2 tablets 2 times a day.  19. Xalatan 0.005% ophthalmic solution 1 drop to each eye at bedtime.   PHYSICAL EXAMINATION: VITAL SIGNS: Pulse of 102, blood pressure of 114/53, saturating 95% on room air. Respirations 18, temperature 98.1.  GENERAL: Obese, African-American male patient lying in bed, smiling.  PSYCHIATRIC: Alert, awake, seems pleasant but nonverbal.  HEENT: Atraumatic, normocephalic. Edentulous. Pallor positive. No icterus. Pupils bilaterally equal and reactive to light.  NECK: Supple. No lymphadenopathy palpable. Trachea midline, no carotid bruits or JVD. CARDIOVASCULAR: S1, S2 with a loud systolic murmur. Peripheral pulses 2+ with edema in the left upper extremity.  GASTROINTESTINAL: Soft abdomen, nontender. Bowel sounds present. No organomegaly palpable.  SKIN: Warm and dry. Has a sacral decubitus ulcer along with a pressure ulcer on the left foot.  MUSCULOSKELETAL: No joint swelling, redness of the large joints. Normal muscle tone.  NEUROLOGICAL: Moves all 4 extremities.  VASCULAR: Has left upper extremity AV fistula.   LABORATORY DATA: Show a glucose of 124, BUN 12, creatinine 2.55 with sodium 142, potassium 3.3, chloride 103, bicarbonate of 29 with magnesium 1.7. WBC 11.9, hemoglobin of 7, platelets of 296,000.   DIAGNOSTIC DATA: Chest x-ray shows left lower lobe infiltrate, unchanged from prior chest x-ray. Small left pleural effusion.    ASSESSMENT AND PLAN: 1. Gastrointestinal bleed with anemia: The patient likely has chronic blood loss anemia as his vitals are stable and hemoglobin has trended down very slowly. Could be upper or lower gastrointestinal bleed. Will transfuse a unit of blood immediately. Consult gastroenterology for further input with the case. Start him on Protonix b.i.d. We will check the patient's hemoglobin every 6 hours with further transfusion as needed to keep hemoglobin greater than 7.  2. End-stage renal disease on hemodialysis. He did get dialysis today. He has some crackles on examination. We will consult nephrology for further dialysis needs. Presently on room air.  3. Dementia and schizoaffective disorder. Continue medications from home.  4. Deep vein thrombosis prophylaxis with sequential compression devices.   CODE STATUS: Presumed full code.   TIME SPENT: Today on this case was 40 minutes.    ____________________________ Molinda Bailiff Dalante Minus, MD srs:TT D: 02/21/2014 18:57:03 ET T: 02/21/2014 19:27:42 ET JOB#: [id number]  cc: Wardell Heath R. Jameis Newsham, MD, <Dictator> Orie Fisherman MD ELECTRONICALLY SIGNED 02/25/2014 16:29

## 2014-09-26 NOTE — Consult Note (Signed)
Brief Consult Note: Diagnosis: anemia, possible heme positive.   Patient was seen by consultant.   Consult note dictated.   Recommend further assessment or treatment.   Comments: Patient seen and examined, chart reviewed.  Please see full GI consult. I do not feel Mr Ronald [name] is a candidated for sedated GI evaluation.  Management of anemia, this likely multifactorial with h/o possible IDA, ckd/on HD, ACD should be supportive with iron supplements and tfx as needed. Will follow at a distance.  Electronic Signatures: Barnetta ChapelSkulskie, Martin (MD)  (Signed 20-Sep-15 13:57)  Authored: Brief Consult Note   Last Updated: 20-Sep-15 13:57 by Barnetta ChapelSkulskie, Martin (MD)

## 2014-09-26 NOTE — Discharge Summary (Signed)
PATIENT NAME:  Ronald [name], Ronald A MR#:  [id number] DATE OF BIRTH:  [birth year]  DATE OF ADMISSION:  09/30/2013 DATE OF DISCHARGE:  10/06/2013  Addendum   DISCHARGE MEDICATIONS: 1.  Duragesic patch 50 mcg p.o. every 72 hours. 2.  Renvela 800 mg 3 tablets p.o. t.i.d.  3.  Benztropine 1 mg p.o. b.i.d.  4.  Haldol 5 mg p.o. b.i.d. 5.  Lipitor 20 mg p.o. daily.  6.  Midodrine 10 mg p.o. 1 tablet in the morning except on Friday and Sunday.  7.  Zyprexa 5 mg p.o. b.i.d.  8.  Lidocaine ointment to the back as needed for pain.  9.  Ferrous gluconate 325 mg p.o. daily.  10.  Depakote ER 500 mg p.o. at bedtime.  11.  Ativan 1 mg p.o. b.i.d.  12.  Ativan 0.5 mg every 6 hours as needed for anxiety.  13.  Haldol 1 mg p.o. every 12 hours p.r.n. for hallucination.  14.  Benztropine 0.5 mg q.12 hours as needed for ( agitation 15.  Xalatan 0.005% eye drops each eye once a day.  16.  Lac-Hydrin cream apply to bilateral feet once a day.  17.  Levothyroxine 25 mcg p.o. daily.  18,  Nephro-Vite 1 tablet p.o. daily.  19.  Norco 5/325 mg 1 tablet every 4 hours p.r.n.  20.  Senna 50/8.6 mg 2 tablets p.o. b.i.d.  21.  Lactulose 10 grams/30 mL p.o. b.i.d. on Sunday. 22. Augmentin 875 mg p.o. daily for 3 days.  23.  Advised to stop aspirin and NSAID use because of recent dental bleeding requiring transfusions.   ____________________________ Katha HammingSnehalatha Maryelizabeth Eberle, MD sk:dmm D: 10/06/2013 09:42:00 ET T: 10/06/2013 10:12:08 ET JOB#: [id number]  cc: Katha HammingSnehalatha Mali Eppard, MD, <Dictator> Katha HammingSNEHALATHA Katelen Luepke MD ELECTRONICALLY SIGNED 10/07/2013 13:48

## 2014-09-26 NOTE — Consult Note (Signed)
Brief Consult Note: Diagnosis: small left sacral decubitus ulcer.   Patient was seen by consultant.   Comments: Small area of full thickness skin necrosis into subcutaneous tissue and to fascia / muscle, which has no associated undermining, no associated cellulitis, and no purulent drainage. I do not believe this explains his sepsis. Nor do I believe it requires debridement or other surgical intervention. Due to the tight skin folds in the area, I do not believe a wound VAC will seal. Will sign off.  Electronic Signatures: Claude MangesMarterre, Yaira Bernardi F (MD)  (Signed 25-Jun-15 16:44)  Authored: Brief Consult Note   Last Updated: 25-Jun-15 16:44 by Claude MangesMarterre, Leyanna Bittman F (MD)

## 2014-09-26 NOTE — Discharge Summary (Signed)
PATIENT NAME:  Ronald [name], Ronald A MR#:  [id number] DATE OF BIRTH:  Feb 08, 1931  DATE OF ADMISSION:  12/31/2013 DATE OF DISCHARGE:  01/02/2014  PRIMARY CARE PHYSICIAN: Dr. Hope BuddsHolly Lehman  CHIEF COMPLAINT: Hypotension. The patient was sent in from dialysis.   DISCHARGE DIAGNOSES: 1.  Suspected healthcare-associated pneumonia. Chest x-ray consistent with left mid to lower lung pneumonia. The patient hemodynamically stable.  2.  Hypotension, improved.  3.  End-stage renal disease on hemodialysis Tuesday, Thursday and Saturday.  4.  Anemia of chronic disease.  5.  History of heparin-induced thrombocytopenia.  6.  Schizoaffective disorder.  7.  Hypothyroidism.  8.  Chronic sacral wound.  CONSULTATIONS: Nephrology with Dr. Thedore MinsSingh.   DIAGNOSTIC DATA: Labs on discharge: White count 4.4, H and H 7.9 and 24.4, and platelet count 136,000. Potassium 4.7. Uric acid is 4. Blood cultures negative in 48 hours.   CT of the head: No acute focal finding.   DISCHARGE DIET: Renal diet.   DISCHARGE INSTRUCTIONS: Resume hemodialysis Tuesday, Thursday and Saturday.   CODE STATUS: FULL.  DISCHARGE MEDICATIONS: 1.  Tylenol 650 mg p.o. q. 4 p.r.n.  2.  Acetaminophen/hydrocodone 5/325 one to two q. 4 p.r.n.  3.  Atorvastatin 20 mg at bedtime.  4.  Benztropine 1 mg b.i.d.  5.  Depakote ER 500 mg at bedtime.  6.  Fentanyl patch 50 mcg topical q. 3 days.  7.  Haldol 5 mg b.i.d.  8.  Haldol 1 mg b.i.d. p.r.n. for agitation.  9.  Lidocaine 5% ointment apply to affected area b.i.d. p.r.n.  10.  Benztropine 0.5 mg b.i.d. p.r.n. for drooling.  11.  Lorazepam 0.5 mg q. 8 p.r.n.  12.  Collagenase ointment applied to affected area. 13.  Sevelamer 2400 mg t.i.d. with meals.  14.  Olanzapine 5 mg b.i.d.  15.  Olanzapine 20 mg at bedtime.  16.  Multivitamin daily.  17.  Midodrine 10 mg every day at 9:00.  18.  Lorazepam 1 mg b.i.d.  19.  Levothyroxine 0.025 mg daily.  20.  Latanoprost 0.005% ophthalmic drops 1 drop to  both eyes at bedtime.   BRIEF SUMMARY OF HOSPITAL COURSE: Ronald [name] is an 81 year old African American gentleman with end-stage renal disease who presented from dialysis with: 1.  Hypotension, which responded to 1 liter of IV fluid. Was admitted secondary to chest x-ray concerning for possible pneumonia. He was admitted with suspected healthcare-acquired pneumonia. He was here recently in June. He is also a dialysis patient and a nursing home resident. He was started on broad-spectrum antibiotics with Zosyn, vancomycin and Levaquin. Blood cultures remained negative. The patient remained afebrile. White count was stable. His antibiotics were changed to p.o. Levaquin for 3 more doses to complete treatment for pneumonia.  2.  Right arm pain, improved.  3.  End-stage renal disease, on hemodialysis. The patient was resumed back on his hemodialysis schedule.  4.  Anemia of chronic disease. Hemoglobin remained stable at 7.9.  5.  Schizoaffective disorder. The patient was continued on his outpatient psychiatric meds.  6.  Hypothyroidism, on Synthroid.   Overall the patient remained hemodynamically stable. [facility].  TIME SPENT: 40 minutes.  ____________________________ Wylie HailSona A. Allena KatzPatel, MD sap:sb D: 01/02/2014 10:36:10 ET T: 01/02/2014 12:26:14 ET JOB#: [id number]  cc: Omere Marti A. Allena KatzPatel, MD, <Dictator> Willow OraSONA A Gaye Scorza MD ELECTRONICALLY SIGNED 01/14/2014 11:38

## 2014-09-26 NOTE — Consult Note (Signed)
PATIENT NAME:  [name], Janice A MR#:  [id number] DATE OF BIRTH:  1931/04/24  DATE OF CONSULTATION:  10/16/2013  REFERRING PHYSICIAN:   CONSULTING PHYSICIAN:  Audery AmelJohn T. Hansel Devan, MD  HOSPITAL COURSE: This 81 year old man with a history of schizoaffective disorder was brought to the Emergency Room yesterday. He was observed to be agitated and confused, lashing out, requiring a lot of emergency medication to keep him safe. I evaluated him yesterday and noted that he appeared to have been noncompliant with his medicine and possibly had recently been on lower doses of medicine than what he had taken previously. He was restarted on his psychiatric medicine at previously effective doses. Since that time, he has been compliant with oral medication. On re-evaluation today, the patient is awake and makes good eye contact. Psychomotor activity slow. Not agitated. Speech is easy to understand, slow and decreased in total amount. Affect is euthymic, somewhat constricted. Mood is stated as okay. Thoughts are slow with thought blocking, but did not make any obviously bizarre statements and did make reasonable requests about his dialysis and medical treatment. Denies any thoughts of violence or aggression or self harm. He agrees that he needs to continue taking his oral medication.   On re-evaluation today, after getting back on his medication and having an decent night sleep, the patient appears to have returned pretty much to his baseline. Previously, his baseline had been established as still having psychotic symptoms and negative symptoms of schizophrenia, but cooperative with treatment. Today, he indicates clearly that he plans to be cooperative with his dialysis and stay on his medicine. No longer requires inpatient hospitalization given that he has a safe place to live.   PLAN: [facility] They are agreeable to taking him back home.   NEW MEDICATION DOSES ARE AS FOLLOWS: Depakote 500 mg at night,  Haldol 5 mg twice a day, Ativan 1 mg twice a day and olanzapine 20 mg at night plus 5 mg 3 times a day.   DIAGNOSIS, PRINCIPAL AND PRIMARY:  AXIS I: Schizoaffective disorder, bipolar type.  SECONDARY DIAGNOSES:  AXIS I: No further.  AXIS II: No diagnosis.  AXIS III: End-stage renal disease on chronic dialysis, history of thrombocytopenia, recent tooth extraction with bleeding, now recovering.  AXIS IV: Severe from isolation.  AXIS V: Functioning at time of evaluation 50.  ____________________________ Audery AmelJohn T. Shernell Saldierna, MD jtc:aw D: 10/16/2013 10:54:11 ET T: 10/16/2013 11:08:45 ET JOB#: [id number]  cc: Audery AmelJohn T. Tyeasha Ebbs, MD, <Dictator> Audery AmelJOHN T Stephan Nelis MD ELECTRONICALLY SIGNED 10/17/2013 15:35

## 2014-09-26 NOTE — Discharge Summary (Signed)
PATIENT NAME:  [name], Eulas A MR#:  [id number] DATE OF BIRTH:  1931/04/21  DATE OF ADMISSION:  09/30/2013 DATE OF DISCHARGE:  09/30/2013   ADMITTING DIAGNOSIS: Bleeding from the mouth.   DISCHARGE DIAGNOSES: 1.  Bleeding from the mouth due to patient having 4 teeth extracted [facility].  2.  Acute on chronic blood loss anemia, status post transfusion.  3.  Hypertension.  4.  Hypothyroidism.  5.  End-stage renal disease, on dialysis Tuesday, Thursday, Saturday.  6.  History of schizoaffective disorder, bipolar type.  7.  History of liver insufficiency.  8.  Chronic pain syndrome.  9.  Thrombocytopenia, felt to be due to heparin-induced thrombocytopenia in the past.  10.  Chronic hypotension, on midodrine therapy.   CONSULTANTS: None.   PERTINENT LABORATORIES AND EVALUATIONS: The patient's INR on admission 1.1, activated PTT 26.4. LFTs were normal. Glucose 99, BUN 65, creatinine 8.05, sodium 136, potassium 4.7, chloride 100; CO2 is 28. WBC 8.8, hemoglobin 9.8, platelet count 102. Chest x-ray: No active cardiopulmonary processes. Lactic acid 2.9. Subsequent hemoglobin today at 9:27 was 7.7.   HOSPITAL COURSE: Please refer to H and P done by the admitting physician. The patient is an 81 year old African American male with end-stage renal disease, who had 4 of his teeth extracted at [facility] and after that, he started having significant bleeding, came to the ED. The ED physician spoke to the dentist at [facility]. They recommended/told him to give topical Amicar, which helped temporarily controlled the bleeding; however, he continues to have bleeding. He has packing in place. His hemoglobin has dropped. He will be receiving a blood transfusion. The patient also received 1 unit of FFP and continues to have some bleeding. Due to no maxillofacial surgery available here and the risk of increasing bleed, arrangements [facility].   MEDICATIONS: The patient is on Tylenol 650 q.4  p.r.n. for pain, atorvastatin 20 at bedtime, benztropine 1 mg p.o. b.i.d., Depakote 500 at bedtime, Colace 1 tab p.o. b.i.d., fentanyl 50 mcg daily, haloperidol 5 mg b.i.d., lactulose 30 mL b.i.d., latanoprost 0.05% both eyes at bedtime, levothyroxine 0.025 mg daily, lorazepam 1 mg b.i.d., midodrine 10 mg 1 tab p.o. daily, morphine 2 to 4 q.4 p.r.n. for pain, olanzapine 10 at bedtime, olanzapine 5 b.i.d., Zofran 4 mg IV q.4 p.r.n., Renvela 2400 mg t.i.d., metoprolol 2.5 IV q.6, Zosyn 3.37 IV q.12, vancomycin  IV every 6 (Dictation Anomaly) TIME SPENT: 25 minutes.   ____________________________ Lacie ScottsShreyang H. Allena KatzPatel, MD shp:jcm D: 09/30/2013 14:54:11 ET T: 09/30/2013 15:24:22 ET JOB#: [id number]  cc: Darwyn Ponzo H. Allena KatzPatel, MD, <Dictator> Charise CarwinSHREYANG H Diany Formosa MD ELECTRONICALLY SIGNED 10/08/2013 16:16

## 2014-09-26 NOTE — Op Note (Signed)
PATIENT NAME:  [name], Kit A MR#:  [id number] DATE OF BIRTH:  [birth year]  DATE OF PROCEDURE:  12/10/2013  PREOPERATIVE DIAGNOSES: 1.  Complication of dialysis device with inability to cannulate left arm basilic transposition.  2.  End-stage renal disease requiring hemodialysis.  3.  Hypertension.  4.  Schizophrenia.   POSTOPERATIVE DIAGNOSES:  1.  Complication of dialysis device with inability to cannulate left arm basilic transposition.  2.  End-stage renal disease requiring hemodialysis.  3.  Hypertension.  4.  Schizophrenia.   PROCEDURE PERFORMED: Contrast injection, left arm AV fistula.   SURGEON: Renford DillsGregory G. Tysin Salada, M.D.   SEDATION: Local only.   CONTRAST USED: Isovue 20 mL.   FLUOROSCOPY TIME: 0.5 minutes.   INDICATIONS: Mr. Michail Jewelsoteat is an 81 year old gentleman sent by his dialysis center with the presumption of thrombosis of his AV fistula. Upon presentation here, he appears to have a functioning fistula, which they were unable to cannulate. Risks and benefits were reviewed. All questions answered. The patient agrees to proceed.   DESCRIPTION OF PROCEDURE: The patient is taken to special procedures and placed in the supine position with his left arm extended palm upward. The left arm is prepped and draped in sterile fashion. Appropriate timeout is called.   A micropuncture needle is then used to access the fistula near the arterial anastomosis and a micropuncture needle is inserted, microwire followed by microsheath. Hand injection of contrast is then performed through the microsheath. Reflux of contrast through the arterial is also performed. After review of the images, no further treatments are required at this time, and the sheath is pulled and pressure is held, and there are no immediate complications.   INTERPRETATION: There are multiple stents placed within the access zones. There does not however appear to be any hemodynamically significant stenosis or narrowing or scar  tissue within the stents. More proximally the confluence of the basilic vein with the brachial veins is patent as is the axillary vein. In fact, the axillary vein is quite large and would serve as an excellent target in the future. Subclavian, innominate and superior vena cava are widely patent. With reflux images, the arterial anastomosis is widely patent as is the visualized portions of the brachial artery.   No hemodynamically significant lesion identified at this time.   ____________________________ Renford DillsGregory G. Pasha Gadison, MD ggs:sb D: 12/10/2013 17:20:15 ET T: 12/11/2013 07:45:20 ET JOB#: [id number]  cc: Renford DillsGregory G. Jameelah Watts, MD, <Dictator> Renford DillsGREGORY G Alver Leete MD ELECTRONICALLY SIGNED 12/30/2013 12:20

## 2014-09-26 NOTE — Discharge Summary (Signed)
PATIENT NAME:  Ronald [name], Ronald [name] MR#:  [id number] DATE OF BIRTH:  26-Jan-1931  DATE OF ADMISSION:  02/21/2014 DATE OF DISCHARGE:  02/23/2014  DISCHARGE DIAGNOSES: 1.  Anemia of chronic disease with iron deficiency requiring 1 unit of packed red blood cell transfusion. No luminal intervention per gastroenterology.   2.  History of chronic sacral decubitus and bilateral heel ulcer requiring dressing changes per wound team.   SECONDARY DIAGNOSES: 1.  Hypertension.  2.  End-stage renal disease, on hemodialysis.  3.  Sacral wounds, chronic, stage IV, present on admission.  4.  Anemia of chronic disease.  5.  Hypothyroidism.  6.  Dementia.  7.  Schizoaffective disorder.  8.  Malnutrition, severe.   CONSULTATIONS:  1.  GI, Dr. Barnetta ChapelMartin Skulskie.  2.  Nephrology, Dr. Mosetta PigeonHarmeet Singh.  3.  Palliative care, Dr. Harriett SineNancy Phifer.   PROCEDURES AND RADIOLOGY: None.   HISTORY AND SHORT HOSPITAL COURSE: The patient is an 81 year old male with above-mentioned medical problems, who was admitted for anemia with [name] hemoglobin of 7 requiring 1 unit of packed red blood cell transfusion. GI consultation was obtained with Dr. Barnetta ChapelMartin Skulskie who recommended no luminal intervention and just conservative management considering advanced age and significant comorbidities. He also recommended continuing iron supplementation and Epogen as needed for conservative management of anemia. Nephrology was consulted for hemodialysis need while in the hospital. Palliative care consultation was obtained with Dr. Harriett SineNancy Phifer who had [name] discussion with family and the patient was set up to get palliative care evaluation while at the facility and looking into possible hospice services restarted if and when family decides to stop dialysis while at the facility. The patient was close to his baseline. The patient was discharged back to skilled nursing facility on September 21 in stable condition.   On the date of discharge, his vital signs were as  follows: Temperature 97.7, heart rate 78 per minute, respirations 20 per minute, blood pressure 135/66. He was saturating 92% on room air.   PERTINENT PHYSICAL EXAMINATION ON THE DATE OF DISCHARGE:  CARDIOVASCULAR: S1, S2 normal. No murmurs, rubs, or gallop.  LUNGS: Clear to auscultation bilaterally. No wheezing, rales, rhonchi, or crepitation.  ABDOMEN: Soft, benign.  NEUROLOGIC: Nonfocal examination. All other all other physical examination remained at baseline. The patient has [name] left upper extremity AV fistula with no signs of infection. All of the physical examination remained at baseline.   DISCHARGE MEDICATIONS: Medication Instructions  lactulose 10 g/15 ml oral syrup  30 milliliter(s) orally 2 times [name] day on Sunday   haloperidol 5 mg oral tablet  1 tab(s) orally 2 times [name] day   depakote er 500 mg oral tablet, extended release  1 tab(s) orally once [name] day (at bedtime)   ativan 0.5 mg oral tablet  1 tab(s) orally every 6 hours, As Needed - for Anxiety, Nervousness   haloperidol 1 mg oral tablet  1 tab(s) orally every 12 hours, As Needed for hallucinations   levothyroxine 25 mcg (0.025 mg) oral tablet  1 tab(s) orally once [name] day   duragesic-50 transdermal film, extended release  1 patch transdermal every 72 hours   ativan 1 mg oral tablet  1 tab(s) orally 2 times [name] day on Monday, Wednesday, Friday, and Sunday   benztropine 0.5 mg oral tablet  1 tab(s) orally every 12 hours, As Needed for drooling   benztropine 1 mg oral tablet  1 tab(s) orally 2 times [name] day   lac-hydrin 5 5% topical lotion  Apply topically to bilateral feet once [name] day   lipitor 20 mg oral tablet  1 tab(s) orally once [name] day (at bedtime)   senna 8.6 mg oral tablet  2 tab(s) orally 2 times [name] day   midodrine 10 mg oral tablet  1 tab(s) orally once [name] day (in the morning) prior to dialysis on Tuesday, Thursday, and Saturday   lidocaine topical 5% topical ointment  Apply topically to back 2 times [name] day, As Needed - for Pain    renvela carbonate 0.8 g oral powder for reconstitution  1 packet(s) orally 3 times [name] day (with meals). *may hold  9am dose on dialysis days Tuesday, Thursday, and Saturday*  (note dose)   olanzapine 2.5 mg oral tablet  1 tab(s) orally 2 times [name] day   ferrous gluconate 325 mg oral tablet  1 tab(s) orally once [name] day   nephro-vite vitamin b complex with c and folic acid oral tablet  1 tab(s) orally once [name] day (in the evening)   ativan 1 mg oral tablet  1 tab(s) orally once [name] day. *hold on dialysis days morning*   midodrine 10 mg oral tablet  1 tab(s) orally once [name] day on Mondays and Fridays   norco 325 mg-5 mg oral tablet  1 tab(s) orally every 8 hours, As Needed - for Pain     DISCHARGE DIET: Low sodium, dialysis diet.   DISCHARGE ACTIVITY: As tolerated.   DISCHARGE INSTRUCTIONS AND FOLLOWUP: The patient was instructed to follow up with Dr. Verl Bangs in 1 to 2 days at the facility. He will need followup with Dr. Barnetta Chapel at [facility] in 1 to 2 weeks. He will get hemodialysis as scheduled. He remains at very high risk for readmission. He will get palliative care evaluation while at the facility to consider possible hospice services resumed.    TOTAL TIME DISCHARGING THIS PATIENT: 55 minutes.     ____________________________ Ellamae Sia. Sherryll Burger, MD vss:at D: 02/26/2014 11:25:55 ET T: 02/26/2014 12:07:40 ET JOB#: [id number]  cc: Ronald Revere S. Sherryll Burger, MD, <Dictator> Dr. Verl Bangs Ned Grace, MD Christena Deem, MD Mosetta Pigeon, MD   Ellamae Sia [facility] MD ELECTRONICALLY SIGNED 03/02/2014 15:57

## 2014-09-26 NOTE — Consult Note (Signed)
PATIENT NAME:  Ronald [name], Ronald [name] MR#:  [id number] DATE OF BIRTH:  [birth year]  DATE OF CONSULTATION:  02/22/2014  REFERRING PHYSICIAN:   CONSULTING PHYSICIAN:  Christena DeemMartin U. Skulskie, MD  [name] patient of Dr. Milagros LollSrikar Sudini.   REASON FOR CONSULTATION: Anemia.   HISTORY OF PRESENT ILLNESS: The patient is an 81 year old African American male with [name] history of end-stage renal disease on hemodialysis who presented to the Emergency Room after he was apparently found by one of his physicians to have [name] low hemoglobin. His baseline hemoglobin in checking the past year or so has been around 8 to 8.5. He was found to be closer to 7.5 to 7. According to the admitting physician, there was [name] Hemoccult done that was questionable for being weakly positive, although it was possibly contaminated from [name] nearby sacral decubitus wound. The patient is essentially nonverbal. He is unable to respond to questions due to his dementia and history of schizoaffective disorder. He seems to deny abdominal pain. I have reviewed the chart. There was [name] colonoscopy attempted in 2008 for unexplained iron deficiency anemia. However, that was never completed due to the inability to properly prep the patient. The patient does not seem to be in any distress.   PAST MEDICAL HISTORY:  1.  Hypertension. 2.  End-stage renal disease with hemodialysis.  3.  Sacral wound as noted above.  4.  ACD.  5.  Hypothyroidism.  6.  Dementia.  7.  Schizoaffective disorder.   8.  Malnutrition.  9.  Glaucoma. 10. Hyperparathyroidism. 11. Kyphosis. 12. Gastric reflux. 13. History of encephalitis. 14. Hyperlipidemia. 15. History of rhabdomyolysis in December 2008. 16. Prior history of alcohol use. 17. Hypertension. 18. Diabetes.  SOCIAL HISTORY: No alcohol or drug use and no smoking. He is in [name] nursing home.   GASTROINTESTINAL FAMILY HISTORY: Unknown. No family present.   OUTPATIENT MEDICATIONS: Ativan 0.5 mg q. 6 hours p.r.n. for anxiety, Ativan 1 mg  twice [name] day on Monday, Wednesday, Friday, and Sunday, Ativan 1 mg daily, hold on dialysis mornings, benztropine 0.5 mg q. 12 hours, another prescription for benztropine of 1 mg twice [name] day, Depakote ER 500 mg once [name] day, Duragesic 50 transdermal patches every 72 hours, ferrous gluconate 325 mg once [name] day, haloperidol 1 mg every 12 hours.  He has another prescription for haloperidol 5 mg twice [name] day, Lac-Hydrin topical lotion to the feet, lactulose, 30 mg, levothyroxine 25 mcg daily, lidocaine topical ointment to his back for pain, Lipitor 20 mg [name] day, midodrine 10 mg once [name] day prior to dialysis on Tuesday, Thursday, and Saturday, midodrine  10 mg once [name] day on Mondays and Fridays, Nephro-Vite B complex with C, Norco 325/5 mg every 4 hours p.r.n. for pain, olanzapine 2.5 mg twice [name] day, Renvela carbonate 0.8 mg oral powder 1 packet 3 times [name] day, senna 8.6 mg twice [name] day.   ALLERGIES:  HEPARIN CONTAINING COMPOUNDS.   PHYSICAL EXAMINATION:  VITAL SIGNS: Temperature is 98.1, pulse 85, respirations 18, blood pressure 105/59, pulse oximetry 93%.  GENERAL: He is an elderly appearing 81 year old African American male, nonverbal, minimal interaction, minimal eye contact.  HEENT: Normocephalic, atraumatic.  EYES: Anicteric.  NECK: No JVD.  HEART: Regular rate and rhythm.  LUNGS: Clear.  ABDOMEN: Soft, nontender, nondistended. Bowel sounds are positive.  EXTREMITIES: No clubbing, cyanosis, or edema.  ANORECTAL:  As above.   ASSESSMENT:  1.  Anemia, questionable heme-positive test result as noted above. The patient has [name] long history  of anemia, both iron deficient, going back to at least 2008 as well as anemia of chronic disease. His anemia is likely multifactorial. He did have [name] colonoscopy attempted in 2008 but was unable to get that completed due to inability to prep the patient. Currently, he does have [name] sacral decubitus ulcer and this may have contaminated any attempt to get [name] Hemoccult. It is of note  that he has been shown to have low iron, although he is on an iron supplement.   RECOMMENDATIONS:  1.  Would recheck iron studies.  2.  The patient is not [name] candidate for sedated luminal evaluation due to age and comorbidities. If further evaluation is desired would recommend [name] CT scan of the abdomen with oral contrast. 3.  Recommend iron supplementation as needed and/or transfusion as needed that being done on dialysis days.   We will follow at [name] distance.   ____________________________ Christena Deem, MD mus:nr D: 02/22/2014 13:54:34 ET T: 02/22/2014 14:13:57 ET JOB#: [id number]  cc: Christena Deem, MD, <Dictator> Srikar R. Elpidio Anis, MD Christena Deem MD ELECTRONICALLY SIGNED 03/11/2014 10:03
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
## 2014-09-26 NOTE — Consult Note (Signed)
PATIENT NAME:  [name], Osei A MR#:  [id number] DATE OF BIRTH:  1931-04-19  DATE OF CONSULTATION:  11/26/2013  REFERRING PHYSICIAN:     Angelica Ranavid Hower, MD CONSULTING PHYSICIAN:  Stann Mainlandavid P. Sampson GoonFitzgerald, MD  REASON FOR CONSULTATION:  Sepsis and sacral decubitus ulcer.   HISTORY OF PRESENT ILLNESS: This is a pleasant 81 year old gentleman with history of hypertension, end-stage renal disease, hyperthyroidism, who [facility]. He has had a known sacral wound. He then developed fevers and was sent to the ER for the plan to receive IV antibiotics. He was found to have a temperature of 100.9. He was also hypotensive in the 90s. He has since been admitted and started on broad-spectrum antibiotics. Would care is evaluating the wound. Since admission, H and H remains hemodynamically stable and he has been transferred out of the Intensive Care Unit. Not much history is obtained from the patient, as he is a poor historian. It does appear the patient was recently discharged from the psychiatric unit; has a history of schizoaffective disorder.   PAST MEDICAL HISTORY: 1.  End-stage renal disease.  2.  Hypothyroidism.  3.  Schizoaffective disorder.  4.  Chronic pain syndrome.  5.  Chronic thrombocytopenia.  6.  Hypertension, on midodrine. .  7.  There is mention in a prior discharge note from May 4th that he has HIV.    SOCIAL HISTORY: [facility]. No alcohol, tobacco or drugs.   FAMILY HISTORY: Noncontributory.  REVIEW OF SYSTEMS:  Unable to be obtained.   ALLERGIES:  HEPARIN.  ANTIBIOTICS SINCE ADMISSION:  Include Zosyn and vancomycin.   PHYSICAL EXAMINATION: VITAL SIGNS: Temperature 97.4, pulse 91, blood pressure 133/73, respirations 99% on room air. GENERAL: He is disheveled. He is lying in bed. He is not very communicative. Nurse reports that he has been more oriented earlier in the day.   HEENT: Pupils equal, round and reactive to light and accommodation.  OROPHARYNX:  Clear with no thrush, but mucous membranes are dry.  NECK: Supple.  HEART: Regular.  LUNGS: Clear.  ABDOMEN: Soft, mildly distended. He does seem to have dilated loops of colon with some stool palpable per his abdominal exam.  EXTREMITIES: He has 1+ edema, bilateral lower extremities.  SKIN: He has a right sacral area, an opened would that has a yellowish exudate on it. This does not seem to probe deep. There is no undermining.   DATA: White blood count on admission 6.2, hemoglobin 7.7, platelets 179. Blood cultures x 2 are no growth from 24. Urinalysis had 10 white cells. Renal function is consistent with end-stage renal disease. LFTs are normal, except albumin low at 2.3.   CULTURE DATA: Culture of his sacral decubitus ulcer done 11/20/2013, as an outpatient, showed light growth of Acinetobacter, light growth enterococcus, heavy growth Prevotella, light growth of Proteus, heavy growth another Prevotella species. Sensitivities are as in the micro data.  MPRESSION: An 81 year old admitted with sacral decubitus, as well as possible sepsis, although he has a normal white count and is hemodynamically stable. Likely source is his sacral wound; this has grown multiple organisms. It does not appear, overwhelmingly, infected. I agree with wound care that Santyl would be the best dressing at this point to help with some chemical debridement. There is also a question of whether he has human immunodeficiency virus.    RECOMMENDATIONS: Continue current antibiotics. He has remained stable; I would suggest we back off the oral antibiotics to cover the sacral decubitus. It  would be difficult to cover all of those organisms, especially the Acinetobacter; however, Augmentin may be sufficient as it would provide broad coverage.       ____________________________ Stann Mainland. Sampson Goon, MD dpf:aj D: 11/27/2013 00:04:43 ET T: 11/27/2013 05:50:33 ET JOB#: [id number]  cc: Stann Mainland. Sampson Goon, MD, <Dictator> DAVID  Sampson Goon MD ELECTRONICALLY SIGNED 11/27/2013 13:32

## 2014-09-26 NOTE — Discharge Summary (Signed)
PATIENT NAME:  [name], Ronald A MR#:  [id number] DATE OF BIRTH:  1930/09/20  DATE OF ADMISSION:  11/26/2013 DATE OF DISCHARGE:  11/28/2013   ADMISSION DIAGNOSIS: Sepsis.  DISCHARGE DIAGNOSES: 1. Sepsis secondary to sacral decubitus ulcers.  3. End-stage renal disease, on hemodialysis.  4. Schizoaffective disorder.  5. Anemia of chronic disease.     CONSULTATIONS:  1. Dr. Cherylann RatelLateef.  2. Dr. Sampson GoonFitzgerald from infectious disease.  3. Dr. Anda KraftMarterre, surgery.   PERTINENT LABORATORY DATA:  Blood cultures and urine culture negative.  White blood cells 5.5, hemoglobin 7.2, hematocrit 23, platelets are 185.   HOSPITAL COURSE: An 81 year old male who presented with sepsis from the nursing home. For further details, please refer to the H and P.   1. Sepsis, severe. The patient presented with fever, hypotension, and his sepsis was present on admission, which has now resolved. Likely source is sacral decubitus. Blood cultures negative to date. He is on broad-spectrum antibiotics. As per surgery, no indication for debridement. I discussed the case with Dr. Sampson GoonFitzgerald. It does not seem that this sacral decubitus has a raging ulcer. Okay to send home with Augmentin. Continue Santyl and repositioning every 2 hours. 2. End-stage renal disease, on hemodialysis.  3. Schizoaffective disorder, stable.  4. Anemia of chronic disease. His hemoglobin is stable.   DISCHARGE MEDICATIONS:  1. Renvela 800 mg 3 tablets t.i.d. with meals.  2. Lactulose 30 mL b.i.d. on Sundays.  3. Haldol 5 mg b.i.d.  4. Ferrous sulfate 324 daily.  5. Depakote 500 mg at bedtime.  6. Ativan 0.5 mg q.6 hours p.r.n.  7. Haldol 1 mg q.12 hours p.r.n.  8. Synthroid 25 mcg daily.  9. Nephro-Vite 1 tablet daily.  10. Senna 2 tablets b.i.d.  11. Midodrine 1 tablet t.i.d. Tuesday, Thursday, Saturday.  12. Zyprexa 20 mg at bedtime.  13. Zyprexa 5 mg b.i.d.  14. Lidocaine topical t.i.d. p.r.n.  15. Xalatan 0.005% ophthalmic solution at  bedtime.  16. Lipitor 40 mg at bedtime.  17. Duragesic 50 mcg q.72 hours.  18. Santyl daily.  19. Augmentin b.i.d.   DISCHARGE DIET: Low sodium. Nepro Carb supplement daily.   DISCHARGE INSTRUCTIONS: Turn patient every 2 hours. Dressing changes: Apply Santyl to left ischial wound daily 1/4-inch layer and then place normal saline moist, 2 x 2 gauze and cover with dry dressings daily, change daily.   CONDITION: The patient is stable for discharge.   TIME SPENT: Approximately 40 minutes.   ____________________________ Janyth ContesSital P. Juliene PinaMody, MD spm:lb D: 11/28/2013 10:43:18 ET T: 11/28/2013 10:55:30 ET JOB#: [id number]  cc: Sital P. Juliene PinaMody, MD, <Dictator> Janyth ContesSITAL P MODY MD ELECTRONICALLY SIGNED 11/28/2013 13:57

## 2014-09-26 NOTE — Discharge Summary (Signed)
PATIENT NAME:  Ronald [name], Ronald [name] MR#:  [id number] DATE OF BIRTH:  25-Apr-1931  DATE OF ADMISSION:  09/30/2013 DATE OF DISCHARGE:  10/06/2013  DISPOSITION: To Ronald [name].   DISCHARGE DIAGNOSES: 1. Oral bleeding, status post dental extraction. The patient received blood, platelets and FFP. The patient's oral bleeding has stopped now.  2. Possible systemic inflammatory response syndrome.   3. End-stage renal disease, on hemodialysis.  4. Hypothyroidism.  5. Schizoaffective disorder,  bipolar type.  6. Chronic pain syndrome.  7. Thrombocytopenia with [name] history of human immunodeficiency virus. 8. Chronic hypotension on midodrine therapy.   Original discharge summary done by Dr. Auburn BilberryShreyang Patel because the patient originally was [facility] because of oral bleeding, but the patient's oral bleeding now stopped and the patient is stable to go [name].   HOSPITAL COURSE:  1. The patient is an 81 year old African American with history of EsRD,  had 4 of teeth extracted [facility] and the patient started to have significant oral bleeding after that. The patient is admitted here because of the oral bleeding, initially he received Amicar topically to stop the bleeding and the patient's bleeding temporally stopped, but he continued to have bleeding. The patient did receive packing and we kept him n.p.o. The patient had [name] lot of oral bleeding,  and packing in the oral area.  The patient's bleeding finally resolved.  The patient received [name] unit of FFP and 2 units or packed RBC and 1 unit of platelets. The patient was [facility] to [facility] and original discharge summary done on the 04/28, but the patient's oral bleeding has stopped, and the patient's hemoglobin is stable at 9.1, and we are transferring back [name] from where he came in.  2. Regarding his other problems. The patient is seen by nephrology. He is getting dialysis Tuesday, Thursday, and Saturday.   3. History of schizoaffective disorder bipolar type. The patient is on Depakote and also Haldol and olanzapine. 5. Hypothyroidism. Continue levothyroxine 0.025 mg daily.  6. Hypotension, he uses midodrine 10 mg daily.  7. Chronic pain syndrome. He is on fentanyl patch 50 mcg p.o. daily.  8. The patient initially was on antibiotics IV Zosyn because  time  spent more than 30 min  sk:sg D: 10/06/2013 09:28:33 ET T: 10/06/2013 09:58:08 ET JOB#: [id number]  cc: Katha HammingSnehalatha Marcene Laskowski, MD, <Dictator> Katha HammingSNEHALATHA Rashea Hoskie MD ELECTRONICALLY SIGNED 10/07/2013 13:47

## 2014-09-26 NOTE — Op Note (Signed)
PATIENT NAME:  [name], Ronald A MR#:  [id number] DATE OF BIRTH:  [birth year]  DATE OF PROCEDURE:  06/30/2013  PREOPERATIVE DIAGNOSES: 1.  End-stage renal disease.  2.  Poorly functioning left arm arteriovenous fistula.  3.  Status post covered stent placement for pseudoaneurysm of arteriovenous fistula.  4.  Hypertension.  5.  Stroke.   POSTOPERATIVE DIAGNOSES: 1.  End-stage renal disease.  2.  Poorly functioning left arm arteriovenous fistula.  3.  Status post covered stent placement for pseudoaneurysm of arteriovenous fistula.  4.  Hypertension.  5.  Stroke.   PROCEDURE:  1.  Ultrasound guidance for vascular access, left arm arteriovenous fistula.  2.  Left upper extremity fistulogram, central venogram.   SURGEON: Annice NeedyJason S. Dew, MD   ANESTHESIA: Local with moderate conscious sedation.   ESTIMATED BLOOD LOSS: Minimal.   INDICATION FOR PROCEDURE: An 81 year old gentleman with end-stage renal disease. He has a fistula that is unable to be used at this time. He has had a pseudoaneurysm that had to be covered with a covered stent. The bruising is largely resolved, but this has not used for an access. We are performing a fistulogram today for further evaluation to see if this is a usable fistula or if further intervention needs to be performed.   DESCRIPTION OF PROCEDURE: The patient was brought to the vascular suite. After an adequate level of intravenous sedation was obtained, the left upper extremity was sterilely prepped and draped and a sterile surgical field was created. The fistula was accessed in the proximal to mid upper arm in a retrograde fashion under direct ultrasound guidance with a micropuncture needle. A permanent image was recorded. A micropuncture wire and sheath were then placed and upsized to a 6-French sheath. A Kumpe catheter was parked at the arteriovenous  fistula anastomosis and imaging was performed. This showed the fistula to be patent without significant stenosis.  There appeared to be 2 stents in the fistula, both of which were patent and were separated by less than a centimeter. The confluence of the basilic vein into the axillary vein showed some mild narrowing that did not appear to be more than 30% or 35%. The remainder of the central venous circulation was patent. At this point, I marked the area overlying the fistula under ultrasound with a permanent marker and removed the sheath. Pressure was held. Sterile dressing was placed. The patient tolerated the procedure well and was taken to the recovery room in stable condition.    ____________________________ Annice NeedyJason S. Dew, MD jsd:jcm D: 06/30/2013 14:24:21 ET T: 06/30/2013 16:34:33 ET JOB#: [id number]  cc: Annice NeedyJason S. Dew, MD, <Dictator> Annice NeedyJASON S DEW MD ELECTRONICALLY SIGNED 07/08/2013 11:05

## 2014-09-26 NOTE — Op Note (Signed)
PATIENT NAME:  [name], Ronald A MR#:  [id number] DATE OF BIRTH:  25-Dec-1930  DATE OF PROCEDURE:  11/17/2013  PREOPERATIVE DIAGNOSES: 1.  End-stage renal disease.  2.  Stroke.  3.  Poorly functioning left arm arteriovenous fistula.   POSTOPERATIVE DIAGNOSES: 1.  End-stage renal disease.  2.  Stroke.  3.  Poorly functioning left arm arteriovenous fistula.   PROCEDURES PERFORMED: 1.  Ultrasound guidance for vascular access to left upper extremity fistula.  2.  Left upper extremity fistulogram and central venogram.   SURGEON: Annice NeedyJason S. Dew, M.D.   ANESTHESIA:  Local with moderate conscious sedation.   ESTIMATED BLOOD LOSS:  25 mL.   FLUOROSCOPY TIME: Less than 1 minute.   CONTRAST USED:  15 mL.  INDICATIONS FOR PROCEDURE:  An 81 year old African American male with end-stage renal disease well known to us for his dialysis access needs. He had diminished flow and a noninvasive study which suggested some stenosis in his outflow of his fistula. He is brought in for a fistulogram for further evaluation and potential treatment. Risks and benefits were discussed. Informed consent was obtained.   DESCRIPTION OF PROCEDURE: The patient is brought to the vascular suite. The left upper extremity was sterilely prepped and draped and a sterile surgical field was created. I accessed the fistula, about 2 to 3 cm beyond the anastomosis to the artery with a micropuncture needle. A micropuncture wire and sheath were then placed. Imaging was performed through the micropuncture sheath. This demonstrated about a 20% to 30% stenosis in the basilic vein as it entered the axillary vein. It was not high grade. The previously placed stents in the basilic vein were patent. The outflow in the axillary, subclavian and central veins were patent. With compression of the fistula, I could evaluate the arterial anastomosis in the proximal portion of the fistula, which was also found to be widely patent. At this point, I elected  to terminate the procedure, as there were no areas of significant stenosis that required treatment. The sheath was removed. A 4-0 Monocryl pursestring suture was placed. Pressure was held. Sterile dressing was placed. The patient tolerated the procedure well and was taken to the recovery room in stable condition.      ____________________________ Annice NeedyJason S. Dew, MD jsd:dmm D: 11/17/2013 14:09:20 ET T: 11/17/2013 19:19:07 ET JOB#: [id number]  cc: Annice NeedyJason S. Dew, MD, <Dictator> Annice NeedyJASON S DEW MD ELECTRONICALLY SIGNED 11/20/2013 10:52

## 2014-09-26 NOTE — Op Note (Signed)
PATIENT NAME:  Ronald [name], Ronald [name] MR#:  [id number] DATE OF BIRTH:  05-08-1931  DATE OF PROCEDURE:  07/21/2013  PREOPERATIVE DIAGNOSES: 1. End-stage renal disease.  2. Poorly functioning left arm arteriovenous fistula with difficult access and reported pulling of clots with dialysis.  3. Stroke.  4. Hypertension.   POSTOPERATIVE DIAGNOSES:  1. End-stage renal disease.  2. Poorly functioning left arm arteriovenous fistula with difficult access and reported pulling of clots with dialysis.  3. Stroke.  4. Hypertension.   PROCEDURE: 1. Ultrasound  guidance for vascular access to left brachial basilic AV fistula.  2. Left upper extremity fistulogram and central venogram.   SURGEON: Annice NeedyJason S Dew, M.D.   ANESTHESIA: Local.   ESTIMATED BLOOD LOSS: Minimal.   FLUOROSCOPY TIME: Approximately two minutes.   CONTRAST USED: 20 mL.   INDICATION FOR PROCEDURE: This is an 81 year old African American male with end-stage renal disease, well known to our service for his dialysis access needs. He has had difficulties with his access at dialysis. They report pulling clots with dialysis and we are asked to interrogate this. Fistulogram was performed for this reason. Risks and benefits are discussed. Informed consent was obtained.   DESCRIPTION OF PROCEDURE: The patient is brought to the vascular suite. The left upper extremity was sterilely prepped and draped and [name] sterile surgical field was created. Ultrasound was used to visualize [name] fistula. This was accessed near the swing point into the deep venous system beyond the previously placed stents. This is done under direct ultrasound guidance without difficulty with [name] micropuncture needle, micropuncture wire and sheath were then placed. Imaging was then performed through the micropuncture sheath. This demonstrated [name] patent left brachial basilic AV fistula without significant stenosis. There was normal flow in the fistula with compression of the fistula showing the  anastomosis and the brachial artery. The central venous circulation was patent. There were no pseudoaneurysms or other problems with the fistula that were identifiable. At this point, I elected to terminate the procedure. The sheath was removed with 4-0 Monocryl pursestring suture. Pressure was held. Sterile dressing was placed. The patient tolerated the procedure well and was taken to the recovery room in stable condition.    ____________________________ Annice NeedyJason S. Dew, MD jsd:sg D: 07/21/2013 10:20:24 ET T: 07/21/2013 10:27:22 ET JOB#: [id number]  cc: Annice NeedyJason S. Dew, MD, <Dictator> [facility]  Annice NeedyJASON S DEW MD ELECTRONICALLY SIGNED 07/23/2013 10:52

## 2014-09-26 NOTE — H&P (Signed)
PATIENT NAME:  Ronald [name], Ronald [name] MR#:  [id number] DATE OF BIRTH:  Oct 28, 1930  DATE OF ADMISSION:  09/30/2013  REFERRING PHYSICIAN: Joni Fears.   PRIMARY CARE PHYSICIAN: Devoria Albe.   CHIEF COMPLAINT: Bleeding from mouth.   HISTORY OF PRESENT ILLNESS: An 81 year old African American gentleman with history of hypertension, hypothyroidism, end-stage renal disease on hemodialysis who is presenting with oral bleeding. He was evaluated at [place] where he had 4 teeth extractions performed; however, he apparently had persistent bleeding. Thus, subsequently sent to [facility] for further workup and evaluation. In the ER, he had oral packing as well as topical Amicar at the discretion of [place] which helped temporarily control his bleeding. During the course of his stay, he was noted to be persistently tachycardic as well.   REVIEW OF SYSTEMS: Unable to obtain given the patient's current medical condition.   PAST MEDICAL HISTORY: Hypertension, hypothyroidism, end-stage renal disease Tuesday, Thursday, Saturday, anemia.   SOCIAL HISTORY: Currently resides at [facility] skilled nursing facility. No alcohol, tobacco or drug usage.   FAMILY HISTORY: No documented cardiovascular or pulmonary issues.   ALLERGIES: HEPARIN.   HOME MEDICATIONS: Include aspirin 81 mg p.o. daily, Duragesic 50 mg transdermal patch every 72 hours, Norco 325/5 mg p.o. q.4 hours as needed for pain, Ativan 0.5 mg p.o. q.6 hours as needed for anxiety, Depakote extended release 500 mg p.o. once daily, lorazepam 1 mg p.o. b.i.d., Claritin 10 mg p.o. daily, Lipitor 20 mg p.o. at bedtime, benztropine 0.5 mg p.o. b.i.d. as needed for drooling, benztropine 1 mg p.o. b.i.d., haloperidol 1 mg p.o. b.i.d. as needed for hallucinations, haloperidol 5 mg p.o. b.i.d., olanzapine 10 mg p.o. at bedtime, Zyprexa 5 mg p.o. b.i.d., Lac-Hydrin 12% topical cream applied to both feet daily, lidocaine 5% topical ointment right lower  back b.i.d. as needed for pain, ferrous gluconate 324 mg p.o. daily, lactulose 30 mL b.i.d. on Sundays and once daily on every other day, Senna-S 50/8.6 mg 2 tabs b.i.d., midodrine 10 mg p.o. daily except Fridays and Sundays, Systane ophthalmic solution 1 drop to each eye b.i.d., Xalatan 0.005% ophthalmic solution 1 drop to each eye daily, Renvela 800 mg p.o. 3 tablets 3 times daily with meals, levothyroxine 25 mcg p.o. daily, multivitamins 1 tab p.o. daily.   PHYSICAL EXAMINATION:  VITAL SIGNS: Temperature 99.1, heart rate 126, respirations 28, blood pressure 129/70, saturating 98% on room air. Weight 79.4 kg, BMI 25.1.  GENERAL: Ill-appearing African American gentleman who is unable to offer much history given oral bleeding and packing.  HEAD: Normocephalic, atraumatic.  EYES: Pupils equal, round and reactive to light. Extraocular muscles intact. No scleral icterus.  MOUTH: Bloody packing noted throughout his mouth with still some persistent bleeding noted. Dentition poor. No abscesses noted.  EARS, NOSE, THROAT: Throat clear without exudates. No external lesions.  NECK: Supple. No thyromegaly. No nodules. No JVD.  PULMONARY: Clear to auscultation bilaterally without wheezes, rubs or rhonchi. No use of accessory muscles. Good respiratory effort.  CHEST: Nontender to palpation.  CARDIOVASCULAR: S1, S2, regular rate and rhythm. No murmurs, rubs or gallops. No edema. Pedal pulses 2+ bilaterally.  GASTROINTESTINAL: Soft, nontender, nondistended. No masses. Positive bowel sounds. No hepatosplenomegaly.  MUSCULOSKELETAL: No swelling, clubbing, edema. Range of motion full in all extremities.  NEUROLOGIC: Cranial nerves II through XII intact. No gross focal neurological deficits. Sensation intact. Reflexes intact.  SKIN: No ulcerations, lesions, rash, cyanosis. Skin warm, dry. Turgor intact.  PSYCHIATRIC: Mood and affect anxious. He  is awake, though unable to verbally respond given oral packing and  bleeding.   LABORATORY DATA: Chest x-ray performed. No acute cardiopulmonary process. Remainder of laboratory data: Sodium 136, potassium 4.7, chloride 100, bicarb 28, BUN 65, creatinine 8.05, glucose 99. LFTs: Alk phos of 171, otherwise within normal limits. WBC 8.8, hemoglobin 9.8, platelets of 102. PT 13.9, PTT 26.4, INR of 1.1. Lactic acid of 2.9.   ASSESSMENT AND PLAN: An 81 year old gentleman with end-stage renal disease on dialysis, presenting with oral bleeding which is persistent. Actually, he was sent from [place].  1. Persistent oral bleeding: Status post packing and topical Amicar with some improvement in breathing status. Will continue with packing. Get CBCs q.6 hours. Transfusion threshold hemoglobin less than 7. Will type and cross. Also transfuse fresh frozen plasma as well as platelets in hopes to stop any further bleeding. Will hold off on any nonsteroidal anti-inflammatory drugs or anticoagulation.  2. Systemic inflammatory response syndrome: Meeting systemic inflammatory response syndrome criteria with heart rate and respiratory rate. He received empiric antibiotic coverage in the Emergency Department with vancomycin and Zosyn. Will continue empiric Zosyn given oral lesions for now as well as gentle intravenous fluid hydration.  3. End-stage renal disease, on hemodialysis: Consult nephrology for continuation of dialysis.  4. Hypothyroidism: Continue with Synthroid.  5. Venous thromboembolism prophylaxis with sequential compression devices.   The patient is FULL CODE.   TIME SPENT: 55 minutes.   I have been informed by the nursing supervisor that due to one-to-one nursing ratio requirements, the patient must go to a step down unit at this time.   ____________________________ Aaron Mose. Hower, MD dkh:gb D: 09/29/2013 23:55:00 ET T: 09/30/2013 02:29:37 ET JOB#: [id number]  cc: Aaron Mose. Hower, MD, <Dictator> DAVID Woodfin Ganja MD ELECTRONICALLY SIGNED  09/30/2013 20:54

## 2014-09-26 NOTE — Op Note (Signed)
PATIENT NAME:  [name], Merdith A MR#:  [id number] DATE OF BIRTH:  05-10-1931  DATE OF PROCEDURE:  09/03/2013  PREOPERATIVE DIAGNOSIS: End-stage renal disease with functional permanent dialysis access, no longer needing PermCath.   POSTOPERATIVE DIAGNOSIS: End-stage renal disease with functional permanent dialysis access, no longer needing PermCath.   PROCEDURE: Removal of right jugular PermCath.   SURGEON: Rico Junkerhelsea Taquan Bralley, PA-C  ANESTHESIA: Local.   ESTIMATED BLOOD LOSS: Minimal.   INDICATION FOR THE PROCEDURE: The patient is an 81 year old African American male with end-stage renal disease. His access is functional, and he no longer needs his PermCath. This will be removed.   DESCRIPTION OF THE PROCEDURE: The patient is positioned supine in the vascular interventional radiology area. The right neck and chest and existing catheter were sterilely prepped and draped, and a sterile surgical field was created. The area was locally anesthetized copiously with 1% lidocaine. Hemostats were used to help dissect out the cuff. A #11 blade was used to transect the fibrous sheath connected to the cuff. The catheter was then removed in its entirety without difficulty with gentle traction. Pressure was held at the base of the neck. Sterile dressing was placed. The patient tolerated the procedure well.    ____________________________ Hoyle Sauerhelsea N. Ayonna Speranza, PA-C cnh:jcm D: 09/03/2013 14:01:24 ET T: 09/03/2013 14:31:52 ET JOB#: [id number]  cc: Hoyle Sauerhelsea N. Celine Dishman, PA-C, <Dictator> Grady Mohabir N Cleola Perryman PA ELECTRONICALLY SIGNED 09/08/2013 13:24

## 2014-09-26 NOTE — Consult Note (Signed)
PATIENT NAME:  [name], Isaack A MR#:  [id number] DATE OF BIRTH:  [birth year]  DATE OF CONSULTATION:  10/15/2013  REFERRING PHYSICIAN:   CONSULTING PHYSICIAN:  Audery AmelJohn T. Lyllian Gause, MD  IDENTIFYING INFORMATION AND REASON FOR CONSULTATION: An 81 year old male with a history of schizoaffective disorder who was [facility] with a report that he was agitated. Consultation for psychiatric evaluation and treatment.   HISTORY OF PRESENT ILLNESS: Information obtained mostly from the chart and discussion with the Emergency Room doctor. When I came to interview the patient, he had been given medication already and was so sedated he could not speak. He only opened his eyes briefly and then went back to sleep. Apparently, he was very agitated earlier in the Emergency Room requiring several doses of medicine. [facility] is [facility] that he had been refusing his medicine and had been increasingly agitated and difficult to manage. I am not clear whether he had actually been violent against anyone.   PAST PSYCHIATRIC HISTORY: Long history of schizoaffective disorder. Dosing of medicine has been complicated by his dialysis but he had previously been stable on a combination of Zyprexa, Haldol, and Depakote. When he is sick, he becomes very disorganized to the point of being almost delirious. When he is relatively well, he often remains delusional but at least is cooperative and calm. He does have a history of lashing out in his current condition, not likely probably to hurt anyone except by accident. He has an unknown past suicide history.   SOCIAL HISTORY: [facility]. I am not sure what family is involved.   PAST MEDICAL HISTORY: End-stage renal disease on dialysis. History of thrombocytopenia. He was recently in the hospital for a delirium, probably related in part to blood loss and pain and inflammatory response from having some teeth removed. He needed packed red  cells from the bleeding for that.    CURRENT MEDICATIONS: According to the most recent discharge, he is on Duragesic patch 50 mcg every 3 days, Renvela 800 mg 3 tablets 3 times a day, Cogentin 1 mg twice a day, Haldol 5 mg twice a day, Lipitor 20 mg per day, midodrine 10 mg once in the morning except not Friday or Sundays, Zyprexa 5 mg twice a day, lidocaine ointment as needed. Iron gluconate 325 mg a day, Depakote ER 500 mg at night, Ativan 1 mg twice a day plus 0.5 mg every 6 hours as needed for anxiety, Haldol 1 mg every 12 hours as needed for hallucinations,  Xalatan eye drops one in each eye per day, Lac-Hydrin cream to feet once a day, levothyroxine 25 mcg a day, Nephro-Vite 1 tablet per day, Norco 5 mg 1 tablet every 4 hours as needed for pain, Senna 50/8.6 two tablets twice a day, lactulose 10 grams b.i.d. only on Sunday.   ALLERGIES: HEPARIN COMPOUNDS, WHICH ARE THOUGHT TO POSSIBLY BE RELATED TO HIS THROMBOCYTOPENIA.   REVIEW OF SYSTEMS: The patient is not able to offer any information.   MENTAL STATUS EXAMINATION: Elderly gentleman in an Emergency Room bay. He was lying still when I came in the room. He did not respond to speaking his (Dictation Anomaly) <<nameMISSING TEXT>> several times. When I spoke his (Dictation Anomaly) <<nameMISSING TEXT>> and gently touched shoulder, he opened his eyes. He was not able to make any sounds, however, and went quickly back to sleep. Did not make any other motions during that time.   LABORATORY RESULTS: His  Depakote level is less than 8. TSH currently normal. Multiple chemistry abnormalities as would be expected. CBC: Normal platelet count,  a little anemic, hematocrit now is 33.1.   ASSESSMENT: This is an 81 year old man with a history of schizoaffective disorder. Looking through his old chart, I see that the last time we got him stabilized on medicine he was actually on Depakote 500 mg at night, Zyprexa 20 mg at night plus 5 mg twice a day, Haldol 5 mg  twice a day, Ativan 1 mg twice a day. That is lower than what he is taking now and he is currently noncompliant. The Emergency Room doctor feels that the people at [facility] feel they are unable to take care of him until he is psychiatrically stable. May need hospitalization for stabilization.   TREATMENT PLAN: The patient because of his multiple medical problems, inability to take care of himself, nursing needs, is inappropriate for admission to our unit. We will begin the process of referring him to a geriatric psychiatry unit. Meanwhile, I am going to put in orders to start him back on his Depakote, Haldol, olanzapine, and Ativan as it was previously. P.R.N. medicine can be used as needed for any agitation.   DIAGNOSIS, PRINCIPAL AND PRIMARY:  AXIS I: Schizoaffective disorder, bipolar type, manic.   SECONDARY DIAGNOSES:  AXIS I: Delirium from multiple medical problems.   AXIS II: Deferred.  AXIS III: End-stage renal disease on dialysis, history of thrombocytopenia, hypothyroidism.  AXIS IV: Severe from his chronic illness.  AXIS V: Functioning at time of evaluation 20.     ____________________________ Audery Amel, MD jtc:dd D: 10/15/2013 17:28:04 ET T: 10/15/2013 19:10:58 ET JOB#: [id number]  cc: Audery Amel, MD, <Dictator> Audery Amel MD ELECTRONICALLY SIGNED 10/17/2013 15:35

## 2015-09-19 IMAGING — XA IR VASCULAR PROCEDURE
1 series · 1 of 1 positions shown · non-contrast
Comparison: none

[Series 1: single · 1 of 1 slices shown]
[im 1/1]
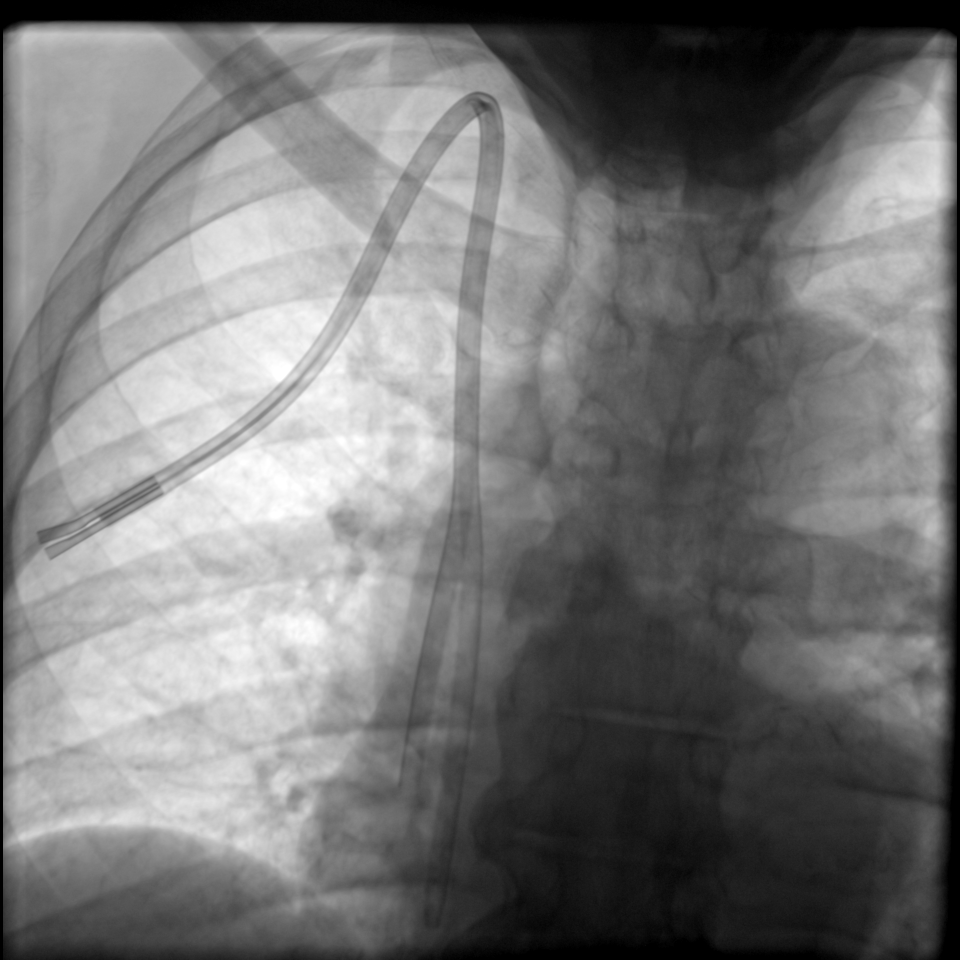

[1 of 1 positions shown; findings below may reference images not displayed]

IMAGES IMPORTED FROM THE [employer] WORKFLOW SYSTEM
NO DICTATION FOR STUDY

## 2016-06-27 IMAGING — CT CT HEAD WITHOUT CONTRAST
3 of 4 series · 17 of 30 positions shown, 19 images · non-contrast
Comparison: 10/15/2013

CLINICAL DATA: Altered mental status. Dialysis patient.
Hypotension.

EXAM:
CT HEAD WITHOUT CONTRAST
TECHNIQUE: Contiguous axial images were obtained from the base of the skull
through the vertex without intravenous contrast.

[Series 2: head wo · axial · 0.44mm/px · z∈[-252,-147]mm · 5 of 33 slices shown]
[im 6/33  brain]
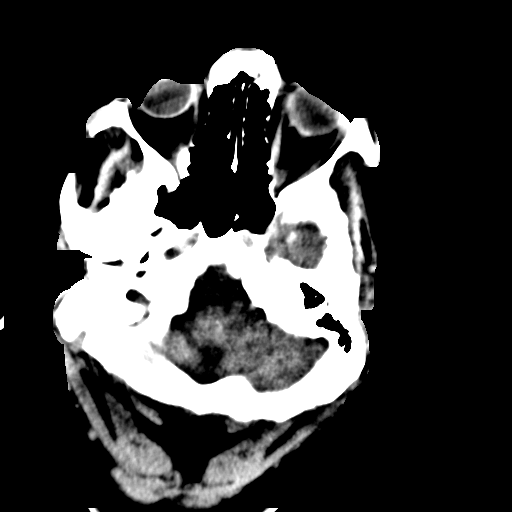
[im 11/33  brain]
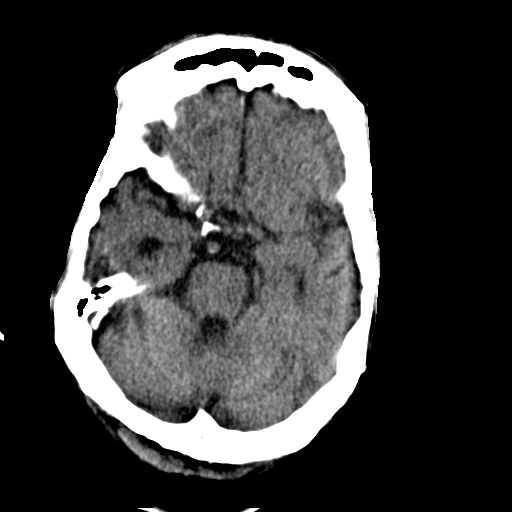
[im 17/33  brain]
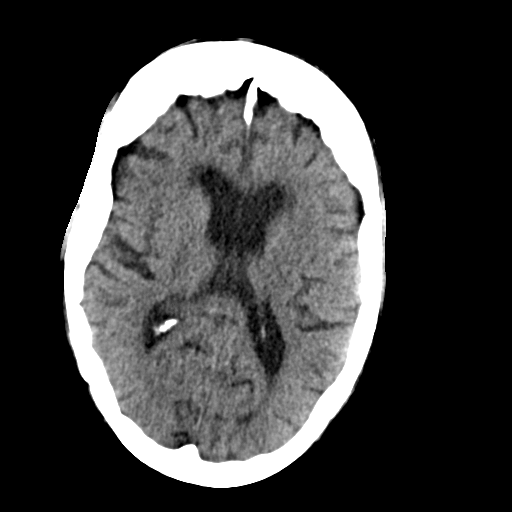
[im 22/33  brain]
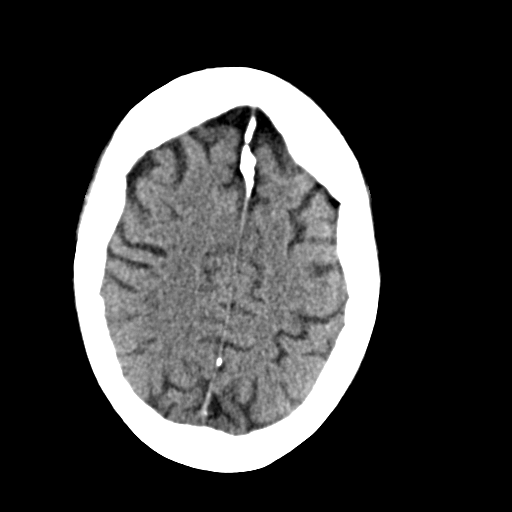
[im 27/33  brain]
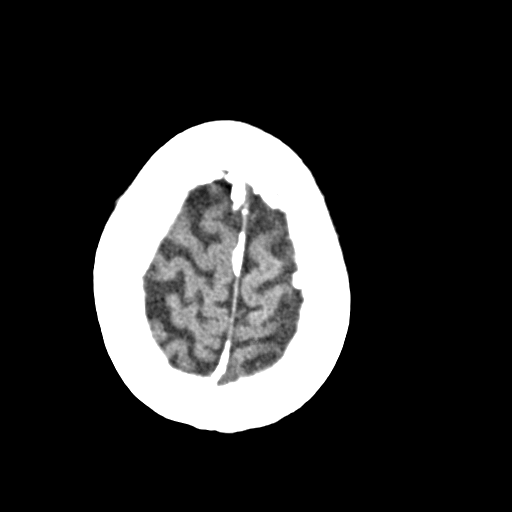

[Series 3: head bone · axial · 0.44mm/px · z∈[-257,-132]mm · 6 of 35 slices shown]
[im 5/35  bone]
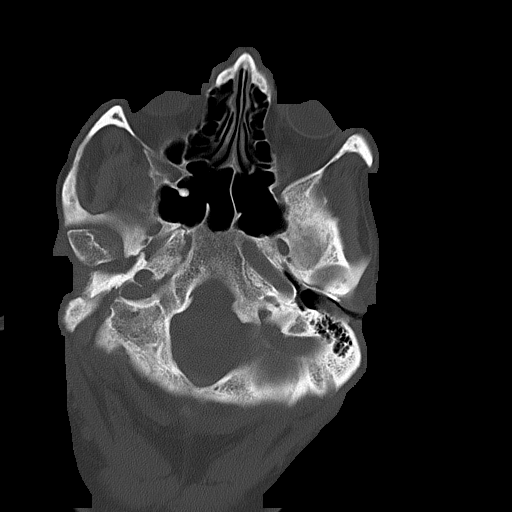
[im 10/35  bone]
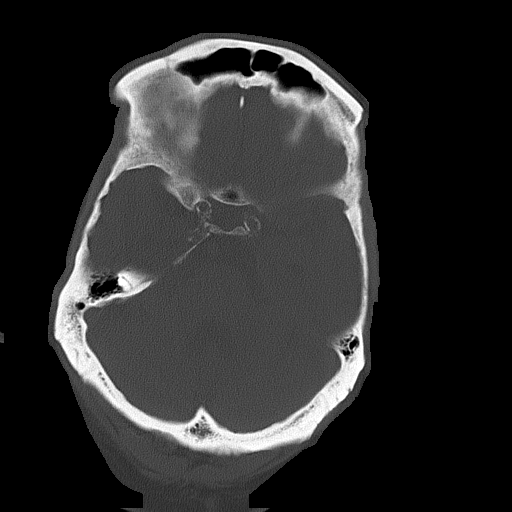
[im 15/35  bone]
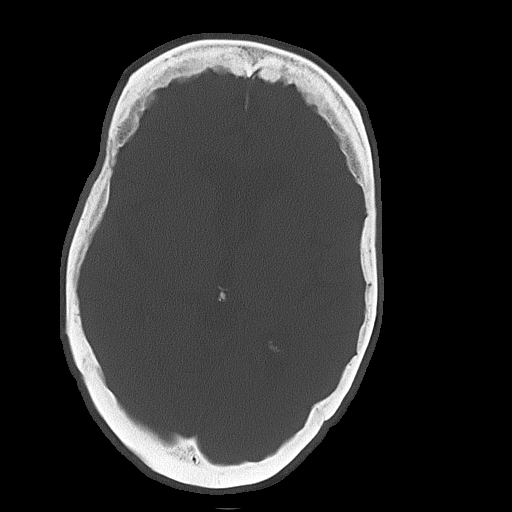
[im 20/35  bone]
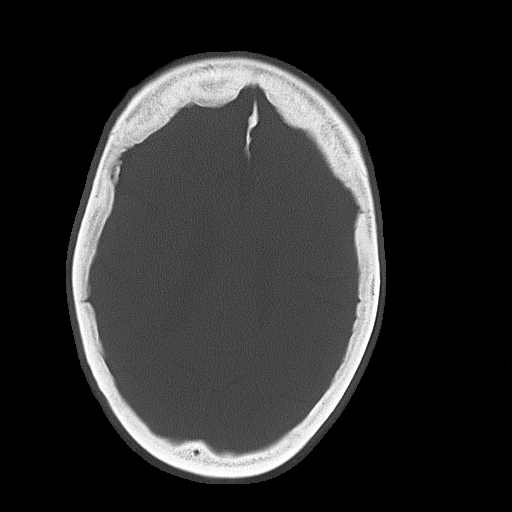
[im 25/35  bone]
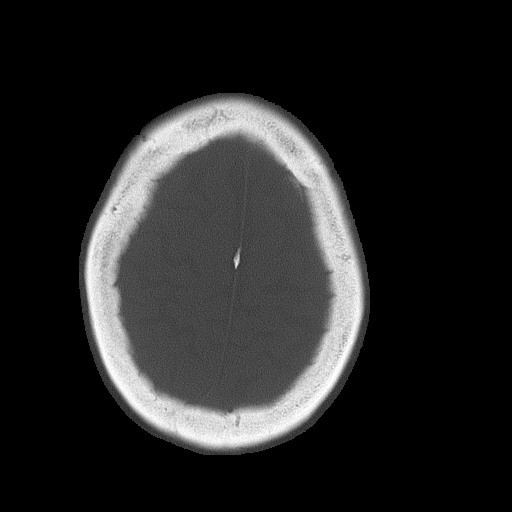
[im 30/35  bone]
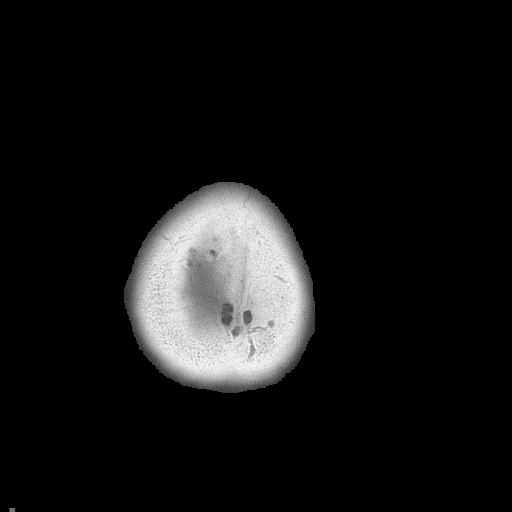

[Series 5: head wo recon · axial · 0.39mm/px · z∈[-255,-130]mm · 6 of 35 slices shown, 8 images]
[im 5/35  brain]
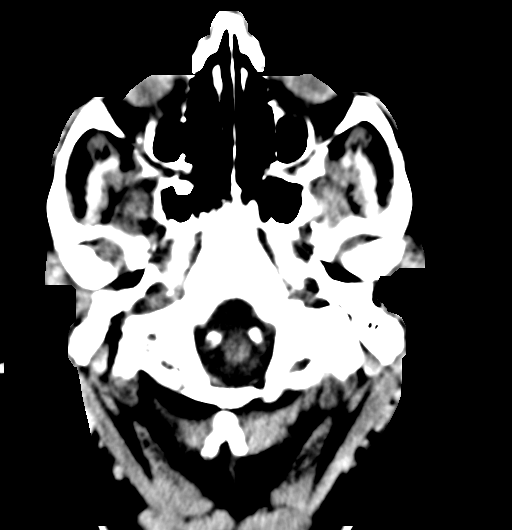
[im 5/35  bone]
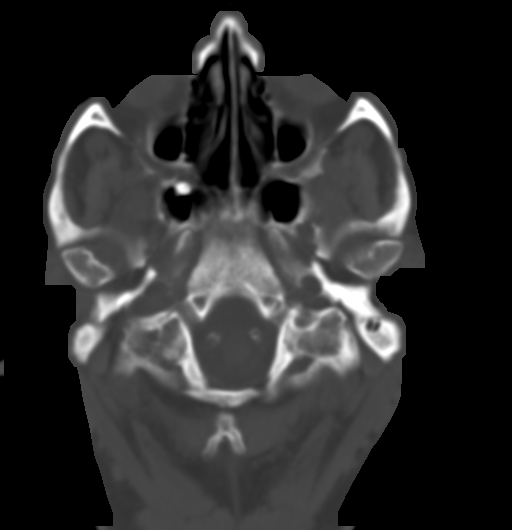
[im 10/35  brain]
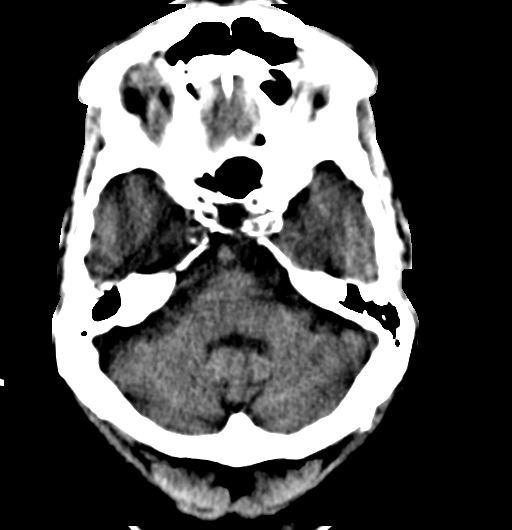
[im 15/35  brain]
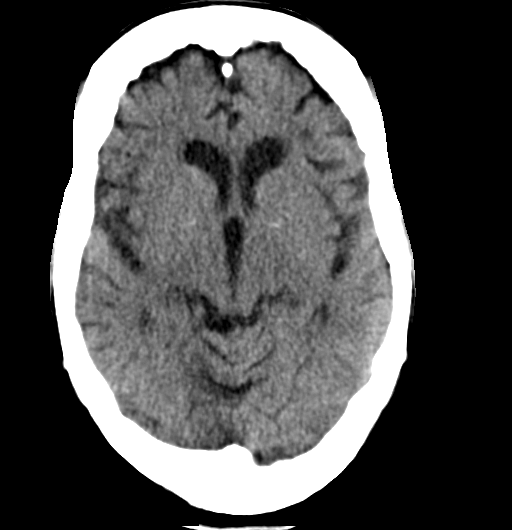
[im 20/35  brain]
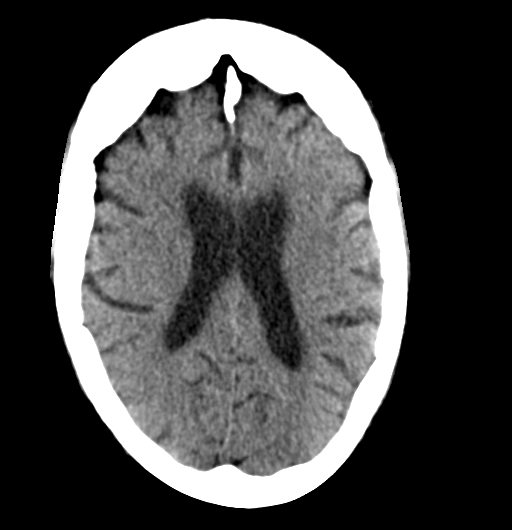
[im 25/35  brain]
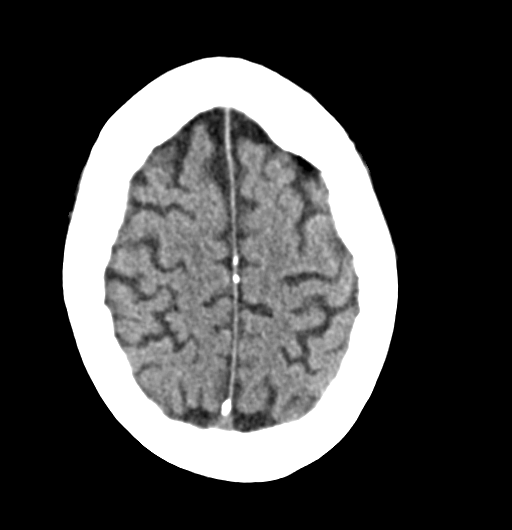
[im 25/35  bone]
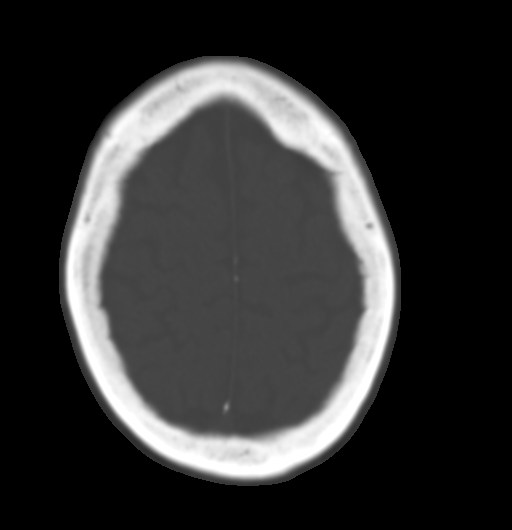
[im 30/35  brain]
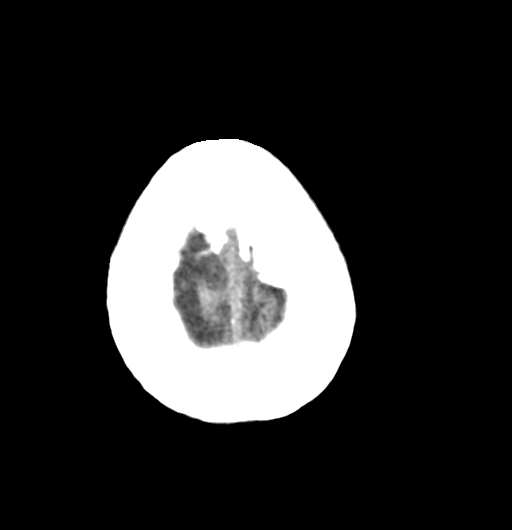

[17 of 30 positions shown; findings below may reference images not displayed]

FINDINGS: The brain shows generalized atrophy. There is no evidence of old or
acute focal infarction, mass lesion, hemorrhage, hydrocephalus or
extra-axial collection. Calvarium is unremarkable. Sinuses are
clear. There is atherosclerotic calcification of the major vessels
at the base of the brain.
IMPRESSION: No acute or focal finding.
